# Patient Record
Sex: Male | Born: 1970 | Hispanic: Yes | Marital: Married | Smoking: Never smoker
Health system: Southern US, Community
[De-identification: ages and names within clinical notes are randomized; demographics above are authoritative.]

## PROBLEM LIST (undated history)

## (undated) DIAGNOSIS — K219 Gastro-esophageal reflux disease without esophagitis: Secondary | ICD-10-CM

## (undated) DIAGNOSIS — H269 Unspecified cataract: Secondary | ICD-10-CM

## (undated) DIAGNOSIS — E785 Hyperlipidemia, unspecified: Secondary | ICD-10-CM

## (undated) HISTORY — DX: Unspecified cataract: H26.9

## (undated) HISTORY — PX: TONSILLECTOMY: SHX5217

## (undated) HISTORY — DX: Hyperlipidemia, unspecified: E78.5

## (undated) HISTORY — DX: Gastro-esophageal reflux disease without esophagitis: K21.9

---

## 2008-02-09 HISTORY — PX: CATARACT EXTRACTION: SUR2

## 2010-11-17 ENCOUNTER — Encounter: Payer: Self-pay | Admitting: Family Medicine

## 2010-11-17 ENCOUNTER — Ambulatory Visit (INDEPENDENT_AMBULATORY_CARE_PROVIDER_SITE_OTHER): Payer: Self-pay | Admitting: Family Medicine

## 2010-11-17 DIAGNOSIS — R252 Cramp and spasm: Secondary | ICD-10-CM

## 2010-11-17 DIAGNOSIS — E782 Mixed hyperlipidemia: Secondary | ICD-10-CM | POA: Insufficient documentation

## 2010-11-17 DIAGNOSIS — E785 Hyperlipidemia, unspecified: Secondary | ICD-10-CM

## 2010-11-17 DIAGNOSIS — E669 Obesity, unspecified: Secondary | ICD-10-CM

## 2010-11-17 DIAGNOSIS — R233 Spontaneous ecchymoses: Secondary | ICD-10-CM

## 2010-11-17 DIAGNOSIS — E7849 Other hyperlipidemia: Secondary | ICD-10-CM

## 2010-11-17 NOTE — Patient Instructions (Signed)
Fue un Research officer, trade union. Me alegro tenerle de paciente!  Estoy [employer] algunos estudios de [place], que se deben hacer despues de 8 horas sin nada de comer ni tomar menos agua.  Por favor venga en ayunas para [employer].  [facility] llamo con los [place] al [phone] [place]).    MAKE FOLLOW UP APPT WITH DR Mauricio Po IN 4 TO 6 WEEKS.

## 2010-11-17 NOTE — Assessment & Plan Note (Signed)
Bruise on L thigh, anterior aspect.  He does not recall how he got the bruise, says this happens frequently. 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 No easy bleeding from gums or epistaxis.  TO check CBC, coags.

## 2010-11-17 NOTE — Assessment & Plan Note (Signed)
Family members with high cholesterol, patient with BMI exceeding 30.  TO check lipids; he may have had checked in the past with elevation.  He is not fasting at the time of this visit, therefore to order to be done as fasting labs at his convenience.

## 2010-11-17 NOTE — Progress Notes (Signed)
  Subjective:    Patient ID: Shawn French, male    DOB: Oct 11, 1970, 40 y.o.   MRN: [id number]  HPI Visit conducted in Spanish. Here today for his first visit with wife, Shawn French.  Feels well, wants to establish and get a checkup.  History of of high cholesterol in the past, not on treatment for this.  Mother and a sister with this.    Does not smoke or drink; gave up both in September 18, 2009.  Lives with wife and daughter; works in [employer].   Family Hx; Denies any known history of cardiac disease or DM in family. Maternal Grandmother died of cancer with unknown primary, no other family members with cancer. No bleeding diatheses in family.    Review of Systems  Denies weight change, no fevers or chills, no cough, no sputum production. No epistaxis, no chest discomfort or palpitations.  Occasional heartburn with acid (vinegar) or fatty meals. No nausea or vomiting, no diarrhea. REPORTS EASY BRUISING WITH BRUISES ARISING IN ANTERIOR THIGHS WITHOUT RECALLING INJURY OR TRAUMA.     Objective:   Physical Exam  Constitutional: He appears well-developed and well-nourished. No distress.  HENT:  Head: Normocephalic.  Right Ear: External ear normal.  Left Ear: External ear normal.  Mouth/Throat: Oropharynx is clear and moist. No oropharyngeal exudate.  Eyes: Conjunctivae are normal. Right eye exhibits no discharge. Left eye exhibits no discharge. No scleral icterus.  Neck: Normal range of motion. Neck supple. No JVD present. No thyromegaly present.  Cardiovascular: Normal rate, regular rhythm and normal heart sounds.  Exam reveals no gallop and no friction rub.   No murmur heard. Pulmonary/Chest: Breath sounds normal. No respiratory distress. He has no wheezes. He has no rales. He exhibits no tenderness.  Abdominal: Soft. Bowel sounds are normal. He exhibits no distension and no mass. There is no tenderness.  Musculoskeletal: Normal range of motion. He exhibits no edema and no tenderness.    Lymphadenopathy:    He has no cervical adenopathy.  Skin: Skin is warm and dry. He is not diaphoretic.       Ecchymosis measuring 2cm2 along anterior aspect L thigh.  Psychiatric: He has a normal mood and affect. His behavior is normal. Judgment and thought content normal.          Assessment & Plan:

## 2010-11-17 NOTE — Assessment & Plan Note (Signed)
Very painful episode leg cramps that woke him from sleep 1 month ago.  Frequently with "legs fall asleep".  To check TSH, CBC for possible RLS causes, although these spells do not sound like RLS.  Likely positional.

## 2010-12-23 ENCOUNTER — Ambulatory Visit: Payer: Self-pay | Admitting: Family Medicine

## 2012-03-03 ENCOUNTER — Ambulatory Visit: Payer: Self-pay | Admitting: Family Medicine

## 2012-04-11 ENCOUNTER — Ambulatory Visit: Payer: Self-pay | Admitting: Family Medicine

## 2012-04-14 ENCOUNTER — Ambulatory Visit: Payer: Self-pay | Admitting: Family Medicine

## 2012-05-09 ENCOUNTER — Encounter: Payer: Self-pay | Admitting: Family Medicine

## 2012-05-09 ENCOUNTER — Ambulatory Visit (INDEPENDENT_AMBULATORY_CARE_PROVIDER_SITE_OTHER): Payer: BC Managed Care – PPO | Admitting: Family Medicine

## 2012-05-09 VITALS — BP 116/68 | HR 66 | Ht 68.0 in | Wt 199.6 lb

## 2012-05-09 DIAGNOSIS — E669 Obesity, unspecified: Secondary | ICD-10-CM

## 2012-05-09 DIAGNOSIS — R233 Spontaneous ecchymoses: Secondary | ICD-10-CM

## 2012-05-09 DIAGNOSIS — R5383 Other fatigue: Secondary | ICD-10-CM | POA: Insufficient documentation

## 2012-05-09 DIAGNOSIS — R5381 Other malaise: Secondary | ICD-10-CM

## 2012-05-09 LAB — CBC WITH DIFFERENTIAL/PLATELET
Eosinophils Relative: 2 % (ref 0–5)
HCT: 43.9 % (ref 39.0–52.0)
Lymphocytes Relative: 28 % (ref 12–46)
Lymphs Abs: 1.7 10*3/uL (ref 0.7–4.0)
MCV: 87.5 fL (ref 78.0–100.0)
Monocytes Absolute: 0.4 10*3/uL (ref 0.1–1.0)
RBC: 5.02 MIL/uL (ref 4.22–5.81)
WBC: 5.9 10*3/uL (ref 4.0–10.5)

## 2012-05-09 LAB — COMPREHENSIVE METABOLIC PANEL
ALT: 15 U/L (ref 0–53)
AST: 19 U/L (ref 0–37)
Albumin: 4.6 g/dL (ref 3.5–5.2)
BUN: 15 mg/dL (ref 6–23)
CO2: 27 mEq/L (ref 19–32)
Calcium: 9.5 mg/dL (ref 8.4–10.5)
Chloride: 104 mEq/L (ref 96–112)
Potassium: 4.2 mEq/L (ref 3.5–5.3)

## 2012-05-09 LAB — TSH: TSH: 1.325 u[IU]/mL (ref 0.350–4.500)

## 2012-05-09 LAB — LIPID PANEL
Cholesterol: 204 mg/dL — ABNORMAL HIGH (ref 0–200)
HDL: 48 mg/dL (ref >39)

## 2012-05-09 NOTE — Patient Instructions (Addendum)
Fue un Research officer, trade union.  Estamos chequeando varios estudios para Neurosurgeon, la tiroide, la funcion renal y hepatica, el conteo de sangre y Armed forces logistics/support/administrative officer.  Le llamo al [phone] con [address] [place].  Quiero verle de nuevo en 4 a 6 semanas.  FOLLOW UP WITH DR Mauricio Po IN 4 TO 6 WEEKS.

## 2012-05-10 NOTE — Assessment & Plan Note (Signed)
Small bruise on anterior L thigh, does not appear worrisome. Reassurance given.  Patient requests blood work to evaluate, has been seen for this in the past.  Does not have easy or prolonged bleeding with minor injuries, no epistaxis, no bleeding from gums or rectum.

## 2012-05-10 NOTE — Assessment & Plan Note (Signed)
High stress in his job.  I am concenred about mood disorders. PHQ9 in Spanish at follow up visit after labs.

## 2012-05-10 NOTE — Assessment & Plan Note (Signed)
Discussed ways to begin to get exercise, which will also help with mood and stress reduction.

## 2012-05-10 NOTE — Progress Notes (Signed)
  Subjective:    Patient ID: Shawn French, male    DOB: [birth year], 42 y.o.   MRN: [id number]  HPI Patient here with his wife; visit conducted in [place].  He is here for checkup, plus addressing a few other issues.  He reports feeling very overwhelmed by stress that is related to work.  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  Stays up at night thinking about work.  Gets up around 6am to go to work, gets home after 7pm.  Doesn't have time to exercise.  Concerned that the stress could give him diabetes. Does not endorse feeling of depression.  Cannot name things he does for recreation.    Concerned about what he describes as easy bruising on his anterior thighs without associated trauma.  Again, concerned about diabetes.   Family Hx; Mother with hypertension, high cholesterol.  Maternal grandmother with cancer NOS.  No history of DM in family. Sister with thyroid disease.   Social Hx; previously a social smoker and beer drinker, quit all of these things 3 years ago.  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  Review of Systems  No chest pain, no shortness of breath, no cough, no fevers/chills, no weight changes, no constipation or blood in stool, no changes in bowel or urinary habits. No swelling in legs.       Objective:   Physical Exam Well appearing, no apparent distress HEENT Neck supple, no cervical adenopathy, no thyroid tenderness or nodules, no goiter.  COR Regular S1S2, no extra sounds PULM Clear bilaterally, no rales or wheezes ABD Soft, nontender, nondistended.  LEs no edema noted.         Assessment & Plan:

## 2012-05-11 ENCOUNTER — Encounter: Payer: Self-pay | Admitting: Family Medicine

## 2012-06-16 ENCOUNTER — Ambulatory Visit (INDEPENDENT_AMBULATORY_CARE_PROVIDER_SITE_OTHER): Payer: BC Managed Care – PPO | Admitting: Family Medicine

## 2012-06-16 ENCOUNTER — Encounter: Payer: Self-pay | Admitting: Family Medicine

## 2012-06-16 VITALS — BP 117/80 | HR 65 | Ht 68.0 in | Wt 194.0 lb

## 2012-06-16 DIAGNOSIS — R5383 Other fatigue: Secondary | ICD-10-CM

## 2012-06-16 MED ORDER — FLUOXETINE HCL 20 MG PO CAPS: 20.0000 mg | ORAL_CAPSULE | Freq: Every day | ORAL | Status: DC

## 2012-06-16 NOTE — Patient Instructions (Addendum)
Mande' la receta a Garment/textile technologist de [facility] en [place] [employer]. Es la fluoxetine 20mg , una capsula por dia por las [place].   Quiero volver a verle en 2 a 3 semanas para ver como esta' yendo con la medicina.    ([address], [place] de [address]    MAKE FOLLOW UP WITH DR Mauricio Po IN 2 TO 3 WEEKS.

## 2012-06-16 NOTE — Assessment & Plan Note (Signed)
Fatigue with component of anxiety that appears to be secondary to workplace stress. This appears to be affecting his relationship with his family.  We discussed alternatives to quitting his job, such as trying to force time for time with his family, regular exercise, etc. Discussed counseling options ([facility] [place]), also medication options.  Shawn French is interested in considering medications,  Will start with fluoxetine 20mg  daily, discussed common side effects and will plan follow up in 2 to 3 weeks to see how he is doing.

## 2012-06-16 NOTE — Progress Notes (Signed)
  Subjective:    Patient ID: Shawn French, male    DOB: Feb 04, 1971, 42 y.o.   MRN: [id number]  HPI Patient here for follow up on stress.  Visit conducted in Spanish.  Patient's wife Shawn French) is here as well.   Shawn French notes that he has continued to feel stressed; he received my letter with lab results and was relieved that he does not have diabetes (his greatest medical concern) or thyroid problems.  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  He is responsible for the crew's performance and often is held responsible for the actions of workers under his supervision.  For this, he works long hours and frequently receives calls about problems on various work sites, even when not physically at work.  He says his family has noted he "never has time", always appears pressured and is easily irritated.  He states that he enjoys his work best on Saturdays, when the [employer] representative crews are usually not working (and therefore not provoking the calls of clients).  He says that the most time he has for vacations in a couple days to make a brief trip with the family (he gives example of the beach), during which time he usually fields calls and works while away with his family.   Denies depressive symptoms. Not engaged in physical activity.  Recognizes work as the primary cause of his stress, but states that the tradeoff of his good salary is difficult to turn down in return for more leisure time and a lower salary/more stress about money in this home.  This tension is something he and his wife talk about frequently.   PHQ9 completed today in Spanish scores a 8 (3 pts Q#4; 2 pts Q#5; 1 pts Q#2,3,7).  Review of Systems see HPI     Objective:   Physical Exam Well appearing, no apparent distress.  Speech fluid, not pressured. Not emotionally labile.        Assessment & Plan:

## 2012-07-04 ENCOUNTER — Ambulatory Visit: Payer: BC Managed Care – PPO | Admitting: Family Medicine

## 2012-10-05 ENCOUNTER — Ambulatory Visit (INDEPENDENT_AMBULATORY_CARE_PROVIDER_SITE_OTHER): Payer: BC Managed Care – PPO | Admitting: Family Medicine

## 2012-10-05 ENCOUNTER — Ambulatory Visit: Payer: BC Managed Care – PPO

## 2012-10-05 VITALS — BP 108/72 | HR 60 | Temp 98.6°F | Resp 18 | Ht 68.0 in | Wt 198.0 lb

## 2012-10-05 DIAGNOSIS — R1011 Right upper quadrant pain: Secondary | ICD-10-CM

## 2012-10-05 DIAGNOSIS — R079 Chest pain, unspecified: Secondary | ICD-10-CM

## 2012-10-05 DIAGNOSIS — R0781 Pleurodynia: Secondary | ICD-10-CM

## 2012-10-05 DIAGNOSIS — S2241XA Multiple fractures of ribs, right side, initial encounter for closed fracture: Secondary | ICD-10-CM

## 2012-10-05 LAB — POCT CBC
Granulocyte percent: 63.8 %G (ref 37–80)
MID (cbc): 0.4 (ref 0–0.9)
MPV: 8.6 fL (ref 0–99.8)
POC Granulocyte: 4.5 (ref 2–6.9)
POC LYMPH PERCENT: 30.1 %L (ref 10–50)
Platelet Count, POC: 315 10*3/uL (ref 142–424)
RDW, POC: 13 %

## 2012-10-05 LAB — COMPREHENSIVE METABOLIC PANEL
Alkaline Phosphatase: 62 U/L (ref 39–117)
Calcium: 9.1 mg/dL (ref 8.4–10.5)
Potassium: 4.3 mEq/L (ref 3.5–5.3)
Sodium: 136 mEq/L (ref 135–145)

## 2012-10-05 MED ORDER — OXYCODONE-ACETAMINOPHEN 5-325 MG PO TABS: 1.0000 | ORAL_TABLET | Freq: Four times a day (QID) | ORAL | Status: DC | PRN

## 2012-10-05 NOTE — Patient Instructions (Signed)
Fractura de costillas (Rib Fracture) Usted ha sufrido una fractura (quebradura de hueso) en [place]. Esto sucede como consecuencia de un golpe en el pecho o una cada contra un objeto duro o por tos o estornudo violentos. Puede haber una o ms fracturas. Las fracturas de costilla pueden curarse por s mismas en 3 a 8 semanas. Los perodos de curacin ms prolongados generalmente se asocian a tos continua o a otras actividades que agravan el problema. INSTRUCCIONES PARA EL CUIDADO DOMICILIARIO  Evite las actividades extenuantes. Sea cuidadoso al [employer]. Evite los golpes en la costilla daada. Los movimientos que Data processing manager y presin Photographer de la fractura debern evitarse en lo posible.  Consuma una dieta normal y bien balanceada. Beba gran cantidad de lquidos para evitar el estreimiento.  Respire profundo varias veces al da para [employer] pulmones libres de infecciones. Trate de toser [employer] al da, sosteniendo el rea lesionada con [place]. Esto ayudar a Charity fundraiser.  No use cinturones ni sujetadores. Esta limitacin de la respiracin puede conducir a la neumona.  Utilice los medicamentos de venta libre o de prescripcin para Chief Technology Officer, Environmental health practitioner o la [place], segn se lo indique el profesional que lo asiste. SOLICITE ATENCIN MDICA SI: Aparece tos continua, asociada a esputo espeso o sanguinolento. SOLICITE ATENCIN MDICA DE INMEDIATO SI:  Tiene fiebre.  Tiene dificultad para respirar.  Tiene nuseas, vmitos o dolor abdominal.  El dolor aumenta y no se alivia con los medicamentos. Document Released: 11/04/2004 Document Revised: 04/19/2011 [facility] Patient Information 2014 [place], [place].

## 2012-10-05 NOTE — Progress Notes (Signed)
Subjective:    Patient ID: Shawn French, male    DOB: 11/25/1970, 42 y.o.   MRN: [id number] Chief Complaint  Patient presents with  . right side pain x1 week    fell on sidewalk     HPI  1 week ago fell about 4 ft onto concrete and landed onto his right side/flank area.  Very severe pain in right with coughing, laughing, bending.  Does a lot of heavy lifting at work which is very painful.  No SHoB or cough, no CP. No n/v, no blood in stool or urine. Nml GI/GU.  Has tried tylenol/ibuprofen with minimal relief.  Past Medical History  Diagnosis Date  . GERD (gastroesophageal reflux disease)   . Hyperlipidemia    Current Outpatient Prescriptions on File Prior to Visit  Medication Sig Dispense Refill  . FLUoxetine (PROZAC) 20 MG capsule Take 1 capsule (20 mg total) by mouth daily.  30 capsule  6   No current facility-administered medications on file prior to visit.   No Known Allergies   Review of Systems  Constitutional: Positive for activity change. Negative for fever and chills.  Respiratory: Negative for cough, chest tightness and shortness of breath.   Cardiovascular: Negative for chest pain.  Gastrointestinal: Positive for abdominal pain. Negative for diarrhea, constipation, blood in stool and anal bleeding.  Genitourinary: Negative for urgency, frequency, hematuria, decreased urine volume and difficulty urinating.  Musculoskeletal: Positive for myalgias, back pain and arthralgias. Negative for gait problem.  Skin: Negative for color change, pallor, rash and wound.  Neurological: Positive for weakness. Negative for dizziness, light-headedness and numbness.  Hematological: Negative for adenopathy. Does not bruise/bleed easily.  Psychiatric/Behavioral: Positive for sleep disturbance.      BP 108/72  Pulse 60  Temp(Src) 98.6 F (37 C) (Oral)  Resp 18  Ht 5\' 8"  (1.727 m)  Wt 198 lb (89.812 kg)  BMI 30.11 kg/m2  SpO2 98% Objective:   Physical Exam  Constitutional: He is  oriented to person, place, and time. He appears well-developed and well-nourished. No distress.  HENT:  Head: Normocephalic and atraumatic.  Eyes: Conjunctivae are normal. Pupils are equal, round, and reactive to light. No scleral icterus.  Neck: Normal range of motion. Neck supple. No thyromegaly present.  Cardiovascular: Normal rate, regular rhythm, normal heart sounds and intact distal pulses.   Pulmonary/Chest: Effort normal and breath sounds normal. No respiratory distress. He has no decreased breath sounds. He has no wheezes. He has no rhonchi. He has no rales. He exhibits tenderness and bony tenderness. He exhibits no laceration, no crepitus, no deformity, no swelling and no retraction. Right breast exhibits no inverted nipple, no skin change and no tenderness. Breasts are symmetrical.    Very ttp over lower lateral and anterior right ribs  Abdominal: Soft. Normal appearance and bowel sounds are normal. There is no hepatosplenomegaly. There is tenderness in the right upper quadrant. There is CVA tenderness. There is no rigidity, no rebound, no guarding, no tenderness at McBurney's point and negative Murphy's sign.  Musculoskeletal: He exhibits no edema.  Lymphadenopathy:    He has no cervical adenopathy.  Neurological: He is alert and oriented to person, place, and time.  Skin: Skin is warm and dry. He is not diaphoretic.  Psychiatric: He has a normal mood and affect. His behavior is normal.      Results for orders placed in visit on 10/05/12  POCT CBC      Result Value Range   WBC 7.0  4.6 -  10.2 K/uL   Lymph, poc 2.1  0.6 - 3.4   POC LYMPH PERCENT 30.1  10 - 50 %L   MID (cbc) 0.4  0 - 0.9   POC MID % 6.1  0 - 12 %M   POC Granulocyte 4.5  2 - 6.9   Granulocyte percent 63.8  37 - 80 %G   RBC 4.92  4.69 - 6.13 M/uL   Hemoglobin 15.4  14.1 - 18.1 g/dL   HCT, POC 16.1  09.6 - 53.7 %   MCV 95.4  80 - 97 fL   MCH, POC 31.3 (*) 27 - 31.2 pg   MCHC 32.8  31.8 - 35.4 g/dL   RDW,  POC 04.5     Platelet Count, POC 315  142 - 424 K/uL   MPV 8.6  0 - 99.8 fL       [facility] reading (PRIMARY) by  Dr. Clelia Croft. KUB: Stool in RUQ, no acute abnormality, normal bowel gas pattern. Rt rib xray: Minimally displaced fracture of distal ends of floating 11th and 12th ribs, poss fracture of posterior lateral 8th and 9th right ribs. Assessment & Plan:  Rib pain on right side - Plan: DG Ribs Unilateral W/Chest Right  Abdominal pain, right upper quadrant - Plan: DG Abd 1 View, POCT CBC, Comprehensive metabolic panel  Fracture of multiple ribs, right, closed, initial encounter - light duty at work- no lifting > 15 lbs x 2 wks, ice, rest. Recheck in 2 wks if pain continues.  Meds ordered this encounter  Medications  . oxyCODONE-acetaminophen (ROXICET) 5-325 MG per tablet    Sig: Take 1 tablet by mouth every 6 (six) hours as needed for pain.    Dispense:  40 tablet    Refill:  0

## 2012-10-07 ENCOUNTER — Encounter: Payer: Self-pay | Admitting: Family Medicine

## 2013-06-23 ENCOUNTER — Emergency Department (HOSPITAL_COMMUNITY)
Admission: EM | Admit: 2013-06-23 | Discharge: 2013-06-23 | Disposition: A | Discharge status: Home / Self Care | Payer: BC Managed Care – PPO | Attending: Emergency Medicine | Admitting: Emergency Medicine

## 2013-06-23 ENCOUNTER — Emergency Department (HOSPITAL_COMMUNITY): Payer: BC Managed Care – PPO

## 2013-06-23 ENCOUNTER — Encounter (HOSPITAL_COMMUNITY): Payer: Self-pay | Admitting: Emergency Medicine

## 2013-06-23 DIAGNOSIS — Z79899 Other long term (current) drug therapy: Secondary | ICD-10-CM | POA: Insufficient documentation

## 2013-06-23 DIAGNOSIS — Z8719 Personal history of other diseases of the digestive system: Secondary | ICD-10-CM | POA: Insufficient documentation

## 2013-06-23 DIAGNOSIS — R209 Unspecified disturbances of skin sensation: Secondary | ICD-10-CM | POA: Insufficient documentation

## 2013-06-23 DIAGNOSIS — R42 Dizziness and giddiness: Secondary | ICD-10-CM

## 2013-06-23 DIAGNOSIS — R202 Paresthesia of skin: Secondary | ICD-10-CM

## 2013-06-23 DIAGNOSIS — Z862 Personal history of diseases of the blood and blood-forming organs and certain disorders involving the immune mechanism: Secondary | ICD-10-CM | POA: Insufficient documentation

## 2013-06-23 DIAGNOSIS — Z87891 Personal history of nicotine dependence: Secondary | ICD-10-CM | POA: Insufficient documentation

## 2013-06-23 DIAGNOSIS — Z8639 Personal history of other endocrine, nutritional and metabolic disease: Secondary | ICD-10-CM | POA: Insufficient documentation

## 2013-06-23 DIAGNOSIS — R29898 Other symptoms and signs involving the musculoskeletal system: Secondary | ICD-10-CM | POA: Insufficient documentation

## 2013-06-23 LAB — CBC
HCT: 43.8 % (ref 39.0–52.0)
Hemoglobin: 14.8 g/dL (ref 13.0–17.0)
MCH: 30.5 pg (ref 26.0–34.0)
MCHC: 33.8 g/dL (ref 30.0–36.0)
MCV: 90.1 fL (ref 78.0–100.0)
Platelets: 281 10*3/uL (ref 150–400)
RBC: 4.86 MIL/uL (ref 4.22–5.81)
RDW: 12.7 % (ref 11.5–15.5)
WBC: 7.1 10*3/uL (ref 4.0–10.5)

## 2013-06-23 LAB — BASIC METABOLIC PANEL
BUN: 15 mg/dL (ref 6–23)
CO2: 24 meq/L (ref 19–32)
CREATININE: 0.63 mg/dL (ref 0.50–1.35)
Calcium: 8.9 mg/dL (ref 8.4–10.5)
Chloride: 106 mEq/L (ref 96–112)
GFR calc Af Amer: >90 mL/min (ref >90)
GFR calc non Af Amer: >90 mL/min (ref >90)
GLUCOSE: 97 mg/dL (ref 70–99)
Potassium: 4.6 mEq/L (ref 3.7–5.3)
Sodium: 142 mEq/L (ref 137–147)

## 2013-06-23 MED ORDER — MECLIZINE HCL 25 MG PO TABS: 25.0000 mg | ORAL_TABLET | Freq: Three times a day (TID) | ORAL | Status: DC | PRN

## 2013-06-23 NOTE — ED Notes (Signed)
Pt. Stated, I've had dizziness since last night and my left leg is numb.

## 2013-06-23 NOTE — ED Notes (Signed)
Pt states got dizzy and nauseated last nite at 0900 and states woke up at 0730 still felt dizzy.  Went to work and at about 0900 left leg felt numb to him and it continues, but better

## 2013-06-23 NOTE — Discharge Instructions (Signed)
Vértigo  (Vertigo)   Vértigo es la sensación de que se está moviendo estando quieto. Puede ser peligroso si ocurre cuando está trabajado, conduciendo vehículos o realizando actividades difíciles.   CAUSAS   El vértigo se produce cuando hay un conflicto en las señales que se envían al cerebro desde los sistemas visual y sensorial del cuerpo. Hay numerosas causas que originan este problema, entre las que se incluyen:   · Infecciones, especialmente en el oído interno.  · Una mala reacción a un medicamento o mal uso de alcohol y fármacos.  · Abstinencia de drogas o alcohol.  · Cambios rápidos de posición, como al acostarse o darse vuelta en la cama.  · Dolor de cabeza migrañoso.  · Disminución del flujo sanguíneo hacia el cerebro.  · Aumento de la presión en el cerebro por un traumatismo, infección, tumor o sangrado en la cabeza.  SÍNTOMAS   Puede sentir como si el mundo da vueltas o va a caer al piso. Como hay problemas en el equilibrio, el vértigo puede causar náuseas y vómitos. Tiene movimientos oculares involuntarios (nistagmus).   DIAGNÓSTICO   El vértigo normalmente se diagnostica con un examen físico. Si la causa no se conoce, el médico puede indicar diagnóstico por imágenes, como una resonancia magnética (imágenes por resonancia magnética).   TRATAMIENTO   La mayor parte de los casos de vértigo se resuelve sin tratamiento. Según la causa, el médico podrá recetar ciertos medicamentos. Si se relaciona con la posición del cuerpo, podrá recomendarle movimientos o procedimientos para corregir el problema. En algunos casos raros, si la causa del vértigo es un problema en el oído interno, necesitará una cirugía.   INSTRUCCIONES PARA EL CUIDADO DOMICILIARIO  · Siga las indicaciones del médico.  · Evite conducir vehículos.  · Evite operar maquinarias pesadas.  · Evite realizar tareas que serían peligrosas para usted u otras personas durante un episodio de vértigo.  · Comuníquele al médico si nota que ciertos medicamentos  parecen asociarse con las crisis. Algunos medicamentos que se usan para tratar los episodios, en algunas personas los empeoran.  SOLICITE ATENCIÓN MÉDICA DE INMEDIATO SI:  · Los medicamentos no alivian las crisis o hacen que estas empeoren.  · Tiene dificultad para hablar, caminar, siente debilidad o tiene problemas para usar los brazos, las manos o las piernas.  · Comienza a sufrir un dolor de cabeza intenso.  · Las náuseas y los vómitos no se alivian o se agravan.  · Aparecen trastornos visuales.  · Un miembro de su familia nota cambios en su conducta.  · Hay alguna modificación en su trastorno que parece hacerlo empeorar en lugar de mejorar.  ASEGÚRESE DE QUE:   · Comprende estas instrucciones.  · Controlará su enfermedad.  · Solicitará ayuda de inmediato si no mejora o si empeora.  Document Released: 11/04/2004 Document Revised: 04/19/2011  [employer]® Patient Information ©2014 [employer].

## 2013-06-23 NOTE — ED Notes (Signed)
Patient transported to MRI 

## 2013-06-23 NOTE — ED Notes (Signed)
Patient denies pain and is resting comfortably.  

## 2013-06-23 NOTE — ED Provider Notes (Signed)
CSN: [id number]     Arrival date & time 06/23/13  1038 History   First MD Initiated Contact with Patient 06/23/13 1148     Chief Complaint  Patient presents with  . Dizziness  . Extremity Weakness     (Consider location/radiation/quality/duration/timing/severity/associated sxs/prior Treatment) HPI Comments: Patient presents to the ER for evaluation of dizziness and left leg numbness. Patient reports onset of dizziness last night. He reports that he was experiencing intermittent feelings of the room spinning around him with changes in position of his head. This occurred throughout the night when he would get up and was still present this morning. He went to work anyway, around 9 AM, had sudden onset of difficulty walking. He reports that his leg suddenly went to sleep. It has progressively worsened and is almost back to normal at this point in time. He does not have any headache. He is never had similar symptoms before.  Patient is a 43 y.o. male presenting with dizziness and extremity weakness.  Dizziness Extremity Weakness    Past Medical History  Diagnosis Date  . GERD (gastroesophageal reflux disease)   . Hyperlipidemia    History reviewed. No pertinent past surgical history. Family History  Problem Relation Age of Onset  . Hyperlipidemia Mother   . Hypertension Mother   . Hyperlipidemia Sister    History  Substance Use Topics  . Smoking status: Former Games developermoker  . Smokeless tobacco: Not on file  . Alcohol Use: Not on file    Review of Systems  Musculoskeletal: Positive for extremity weakness.  Neurological: Positive for dizziness and numbness.  All other systems reviewed and are negative.     Allergies  Review of patient's allergies indicates no known allergies.  Home Medications   Prior to Admission medications   Medication Sig Start Date End Date Taking? Authorizing Provider  FLUoxetine (PROZAC) 20 MG capsule Take 1 capsule (20 mg total) by mouth daily. 06/16/12    Barbaraann BarthelJames O Breen, MD  oxyCODONE-acetaminophen (ROXICET) 5-325 MG per tablet Take 1 tablet by mouth every 6 (six) hours as needed for pain. 10/05/12   Sherren MochaEva N Shaw, MD   BP 123/83  Pulse 66  Temp(Src) 97.6 F (36.4 C) (Oral)  Resp 17  Ht 5\' 10"  (1.778 m)  Wt 195 lb (88.451 kg)  BMI 27.98 kg/m2  SpO2 97% Physical Exam  Constitutional: He is oriented to person, place, and time. He appears well-developed and well-nourished. No distress.  HENT:  Head: Normocephalic and atraumatic.  Right Ear: Hearing normal.  Left Ear: Hearing normal.  Nose: Nose normal.  Mouth/Throat: Oropharynx is clear and moist and mucous membranes are normal.  Eyes: Conjunctivae and EOM are normal. Pupils are equal, round, and reactive to light.  Neck: Normal range of motion. Neck supple.  Cardiovascular: Regular rhythm, S1 normal and S2 normal.  Exam reveals no gallop and no friction rub.   No murmur heard. Pulmonary/Chest: Effort normal and breath sounds normal. No respiratory distress. He exhibits no tenderness.  Abdominal: Soft. Normal appearance and bowel sounds are normal. There is no hepatosplenomegaly. There is no tenderness. There is no rebound, no guarding, no tenderness at McBurney's point and negative Murphy's sign. No hernia.  Musculoskeletal: Normal range of motion.  Neurological: He is alert and oriented to person, place, and time. He has normal strength. No cranial nerve deficit or sensory deficit. Coordination normal. GCS eye subscore is 4. GCS verbal subscore is 5. GCS motor subscore is 6.  Normal finger to nose  bilaterally No pronator drift upper and lower extremities Strength 5 out of 5, symmetric Lower extremity strength 5 out of 5, symmetric against gravity Normal heel-to-shin.  Skin: Skin is warm, dry and intact. No rash noted. No cyanosis.  Psychiatric: He has a normal mood and affect. His speech is normal and behavior is normal. Thought content normal.    ED Course  Procedures (including  critical care time) Labs Review Labs Reviewed  BASIC METABOLIC PANEL  CBC    Imaging Review Mr Brain Wo Contrast  06/23/2013   CLINICAL DATA:  Dizziness.  Left-sided weakness  EXAM: MRI HEAD WITHOUT CONTRAST  TECHNIQUE: Multiplanar, multiecho pulse sequences of the brain and surrounding structures were obtained without intravenous contrast.  COMPARISON:  none  FINDINGS: Negative for acute infarct.  Ventricle size is normal. Craniocervical junction normal. Pituitary is normal in size.  Negative for hemorrhage or mass lesion. No shift of the midline structures. Cerebral white matter is normal. Negative for demyelinating disease.  Paranasal sinuses are clear.  IMPRESSION: Negative   Electronically Signed   By: Marlan Palauharles  Clark M.D.   On: 06/23/2013 14:10     EKG Interpretation   Date/Time:  Saturday Jun 23 2013 10:48:50 EDT Ventricular Rate:  64 PR Interval:  158 QRS Duration: 86 QT Interval:  368 QTC Calculation: 379 R Axis:   128 Text Interpretation:  Normal sinus rhythm Left posterior fascicular block  Abnormal ECG No previous tracing Confirmed by POLLINA  MD, CHRISTOPHER  ([id number]) on 06/23/2013 12:07:56 PM      MDM   Final diagnoses:  Vertigo  Paresthesia    Patient presents to the ER for evaluation of dizziness and left leg numbness and tingling. The dizziness began last night. This morning he had onset of paresthesia type symptoms in the left leg. Paresthesias are improving. Patient underwent extensive workup including MRI which was negative. I did have a consult by neurology who felt that the patient did not need further workup for TIA, as his symptoms were not consistent with TIA. It was recommended he be treated for peripheral vertigo and followup with primary doctor.    Gilda Creasehristopher J. Pollina, MD 06/24/13 [phone]

## 2013-06-23 NOTE — Consult Note (Signed)
Reason for Consult: Dizziness and left leg numbness Referring Physician: Dr Blinda Leatherwood  CC:  Dizziness and left leg numbness   HPI: Shawn French is a 43 y.o. male who presented to the emergency department today for evaluation of dizziness. The patient states that he took his children shopping at the mall last night and when he returned home he felt dizzy. He felt as if the room was spinning around him. There was no improvement with lying or sitting. The patient eventually went to bed but did not sleep well. He still felt dizzy when he got up this morning but went on to work at his [employer] job. He felt dizzy there as well. He also noted that his left leg was numb from the knee down. He denies back problems. He denies ear pain. The left leg numbness has since resolved. The patient states he never had similar symptoms in the past. An MRI of the brain was performed here which was negative. The patient admits to being under a great deal of stress recently as he has a cousin at [facility] now with cancer of the brain. This has been weighing heavily on him. He had been prescribed an anxiety medication through family practice but no longer takes this medication.  Past Medical History  Diagnosis Date  . GERD (gastroesophageal reflux disease)   . Hyperlipidemia     History reviewed. No pertinent past surgical history.  Family History  Problem Relation Age of Onset  . Hyperlipidemia Mother   . Hypertension Mother   . Hyperlipidemia Sister     Social History:  reports that he has quit smoking. He does not have any smokeless tobacco history on file. His alcohol and drug histories are not on file.  No Known Allergies  Medications: No current facility-administered medications for this encounter.   No current outpatient prescriptions on file.    I have reviewed the patient's current medications.  ROS: History obtained from the patient  The patient reports feeling fine recently and essentially has a  negative review of systems except as mentioned above.  General ROS: negative for - chills, fatigue, fever, night sweats, weight gain or weight loss Psychological ROS: negative for - behavioral disorder, hallucinations, memory difficulties, mood swings or suicidal ideation Ophthalmic ROS: negative for - blurry vision, double vision, eye pain or loss of vision ENT ROS: negative for - epistaxis, nasal discharge, oral lesions, sore throat, tinnitus or vertigo Allergy and Immunology ROS: negative for - hives or itchy/watery eyes Hematological and Lymphatic ROS: negative for - bleeding problems, bruising or swollen lymph nodes Endocrine ROS: negative for - galactorrhea, hair pattern changes, polydipsia/polyuria or temperature intolerance Respiratory ROS: negative for - cough, hemoptysis, shortness of breath or wheezing Cardiovascular ROS: negative for - chest pain, dyspnea on exertion, edema or irregular heartbeat Gastrointestinal ROS: negative for - abdominal pain, diarrhea, hematemesis, nausea/vomiting or stool incontinence Genito-Urinary ROS: negative for - dysuria, hematuria, incontinence or urinary frequency/urgency Musculoskeletal ROS: negative for - joint swelling or muscular weakness Neurological ROS: as noted in HPI Dermatological ROS: negative for rash and skin lesion changes   Physical Examination: Blood pressure 128/71, pulse 59, temperature 98.2 F (36.8 C), temperature source Oral, resp. rate 19, height 5\' 10"  (1.778 m), weight 195 lb (88.451 kg), SpO2 98.00%.  Neurologic Examination Mental Status: Alert, oriented, thought content appropriate.  Speech fluent without evidence of aphasia.  Able to follow 3 step commands without difficulty. Cranial Nerves: II: Discs not visualized; Visual fields grossly normal, pupils equal,  round, reactive to light and accommodation III,IV, VI: ptosis not present, extra-ocular motions intact bilaterally V,VII: smile symmetric, facial light touch  sensation normal bilaterally VIII: hearing normal bilaterally IX,X: gag reflex present XI: bilateral shoulder shrug XII: midline tongue extension Motor: Right : Upper extremity   5/5    Left:     Upper extremity   5/5  Lower extremity   5/5     Lower extremity   5/5 Tone and bulk:normal tone throughout; no atrophy noted Sensory: Pinprick and light touch intact throughout, bilaterally Deep Tendon Reflexes: 2+ and symmetric throughout Plantars: Right: downgoing   Left: downgoing Cerebellar: normal finger-to-nose, normal rapid alternating movements and normal heel-to-shin test Gait: Deferred  Laboratory Studies:   Basic Metabolic Panel:  Recent Labs Lab 06/23/13 1118  NA 142  K 4.6  CL 106  CO2 24  GLUCOSE 97  BUN 15  CREATININE 0.63  CALCIUM 8.9    Liver Function Tests: No results found for this basename: AST, ALT, ALKPHOS, BILITOT, PROT, ALBUMIN,  in the last 168 hours No results found for this basename: LIPASE, AMYLASE,  in the last 168 hours No results found for this basename: AMMONIA,  in the last 168 hours  CBC:  Recent Labs Lab 06/23/13 1118  WBC 7.1  HGB 14.8  HCT 43.8  MCV 90.1  PLT 281    Cardiac Enzymes: No results found for this basename: CKTOTAL, CKMB, CKMBINDEX, TROPONINI,  in the last 168 hours  BNP: No components found with this basename: POCBNP,   CBG: No results found for this basename: GLUCAP,  in the last 168 hours  Microbiology: No results found for this or any previous visit.  Coagulation Studies: No results found for this basename: LABPROT, INR,  in the last 72 hours  Urinalysis: No results found for this basename: COLORURINE, APPERANCEUR, LABSPEC, PHURINE, GLUCOSEU, HGBUR, BILIRUBINUR, KETONESUR, PROTEINUR, UROBILINOGEN, NITRITE, LEUKOCYTESUR,  in the last 168 hours  Lipid Panel:     Component Value Date/Time   CHOL 204* 05/09/2012 1124   TRIG 132 05/09/2012 1124   HDL 48 05/09/2012 1124   CHOLHDL 4.3 05/09/2012 1124   VLDL 26  05/09/2012 1124   LDLCALC 130* 05/09/2012 1124    HgbA1C:  Lab Results  Component Value Date   HGBA1C 5.3 05/09/2012    Urine Drug Screen:   No results found for this basename: labopia, cocainscrnur, labbenz, amphetmu, thcu, labbarb    Alcohol Level: No results found for this basename: ETH,  in the last 168 hours  Other results: EKG: NSR rate 64 BPM  Imaging:  Mr Brain Wo Contrast 06/23/2013   Negative       Assessment/Plan: 43 year old male presents for evaluation of dizziness which started last night and persists today. An MRI of the brain was negative. The patient admits to being under a significant amount of stress recently with a family member who is seriously ill. He had been on medications for anxiety in the past but no longer takes the medication. He does not remember the medication name.  Further impression and plan per Dr. Uvaldo RisingKirkpatrick  David Rinehuls PA-C [facility] Pager [phone] 06/23/2013, 5:07 PM   43 yo M with a history of migraines and mild current headache. He has had dizziness for multiple hours and it is already improving. Symptoms are elicited with dix-hallpike but no clear nystagmus is seen. The leg symptoms were positive(tingling) rather than negative. In the setting of a patient with history of migraine, few  other risk factors for stroke and positive, not negative symptoms I think that TIA is unlikely and would favor migraine aura. With his dizziness improving and no objective signs of vestibular dysfunction on exam, would not treat with steroids at this time. Given the duration of symptoms, if the dizziness was due to ischemia.   1) Meclizine PRN for dizziness.  2) No further recommendations at this time, but patient should return to ER for any further concerning symptoms.   Ritta SlotMcNeill Anasia Agro, MD [facility] [phone]  If 7pm- 7am, please page neurology on call as listed in AMION.

## 2013-06-23 NOTE — ED Notes (Signed)
Pt still in MRI 

## 2013-06-23 NOTE — ED Notes (Signed)
Pt ambulatory to restroom

## 2013-08-02 ENCOUNTER — Ambulatory Visit (INDEPENDENT_AMBULATORY_CARE_PROVIDER_SITE_OTHER): Payer: BC Managed Care – PPO | Admitting: Family Medicine

## 2013-08-02 VITALS — BP 126/84 | HR 74 | Temp 97.8°F | Resp 16 | Ht 68.25 in | Wt 188.6 lb

## 2013-08-02 DIAGNOSIS — S8012XA Contusion of left lower leg, initial encounter: Secondary | ICD-10-CM

## 2013-08-02 DIAGNOSIS — M79609 Pain in unspecified limb: Secondary | ICD-10-CM

## 2013-08-02 DIAGNOSIS — S8010XA Contusion of unspecified lower leg, initial encounter: Secondary | ICD-10-CM

## 2013-08-02 DIAGNOSIS — M79605 Pain in left leg: Secondary | ICD-10-CM

## 2013-08-02 NOTE — Progress Notes (Signed)
Subjective: 43 year old man who had a long that was standing up fell against his left leg about 12 days ago. It struck him just above the left popliteal fossa and then on down the leg. He continued functioning in life. This week he drove to [place] and back, 0.4 hours each way. He got a lot of swelling in the left leg. It started hurting more around the ankle. He developed ecchymosis around the heel and toes. Otherwise he is a healthy person. Not on any regular medicine.  Objective: No tenderness in the thigh. He has ecchymosis just above the medial aspect of his left popliteal fossa. It is a little tender there down to the mid calf, more significantly tender from the lower calf down into the foot. Negative Homans sign. He has 2+ to 3+ swelling. Pulses good.  Assessment: Trauma left leg with lower leg pain and swelling. Low to medium probability of DVT.  Plan: Elevate tonight. We will try to get a ultrasound of the leg first thing in the morning as soon as we can get it scheduled. Naproxen 440 mg twice daily

## 2013-08-02 NOTE — Patient Instructions (Signed)
Take Aleve 2 tablets twice daily.  Our office should contact you tomorrow morning to let you know where to go for the ultrasound. If you do not hear from the office by 12 noon please call and asked to speak to the team leader

## 2014-09-19 IMAGING — CR DG ABDOMEN 1V
1 series · 1 of 1 positions shown · non-contrast
Comparison: None.

CLINICAL DATA: Right rib pain.

EXAM:
ABDOMEN - 1 VIEW

[AP]
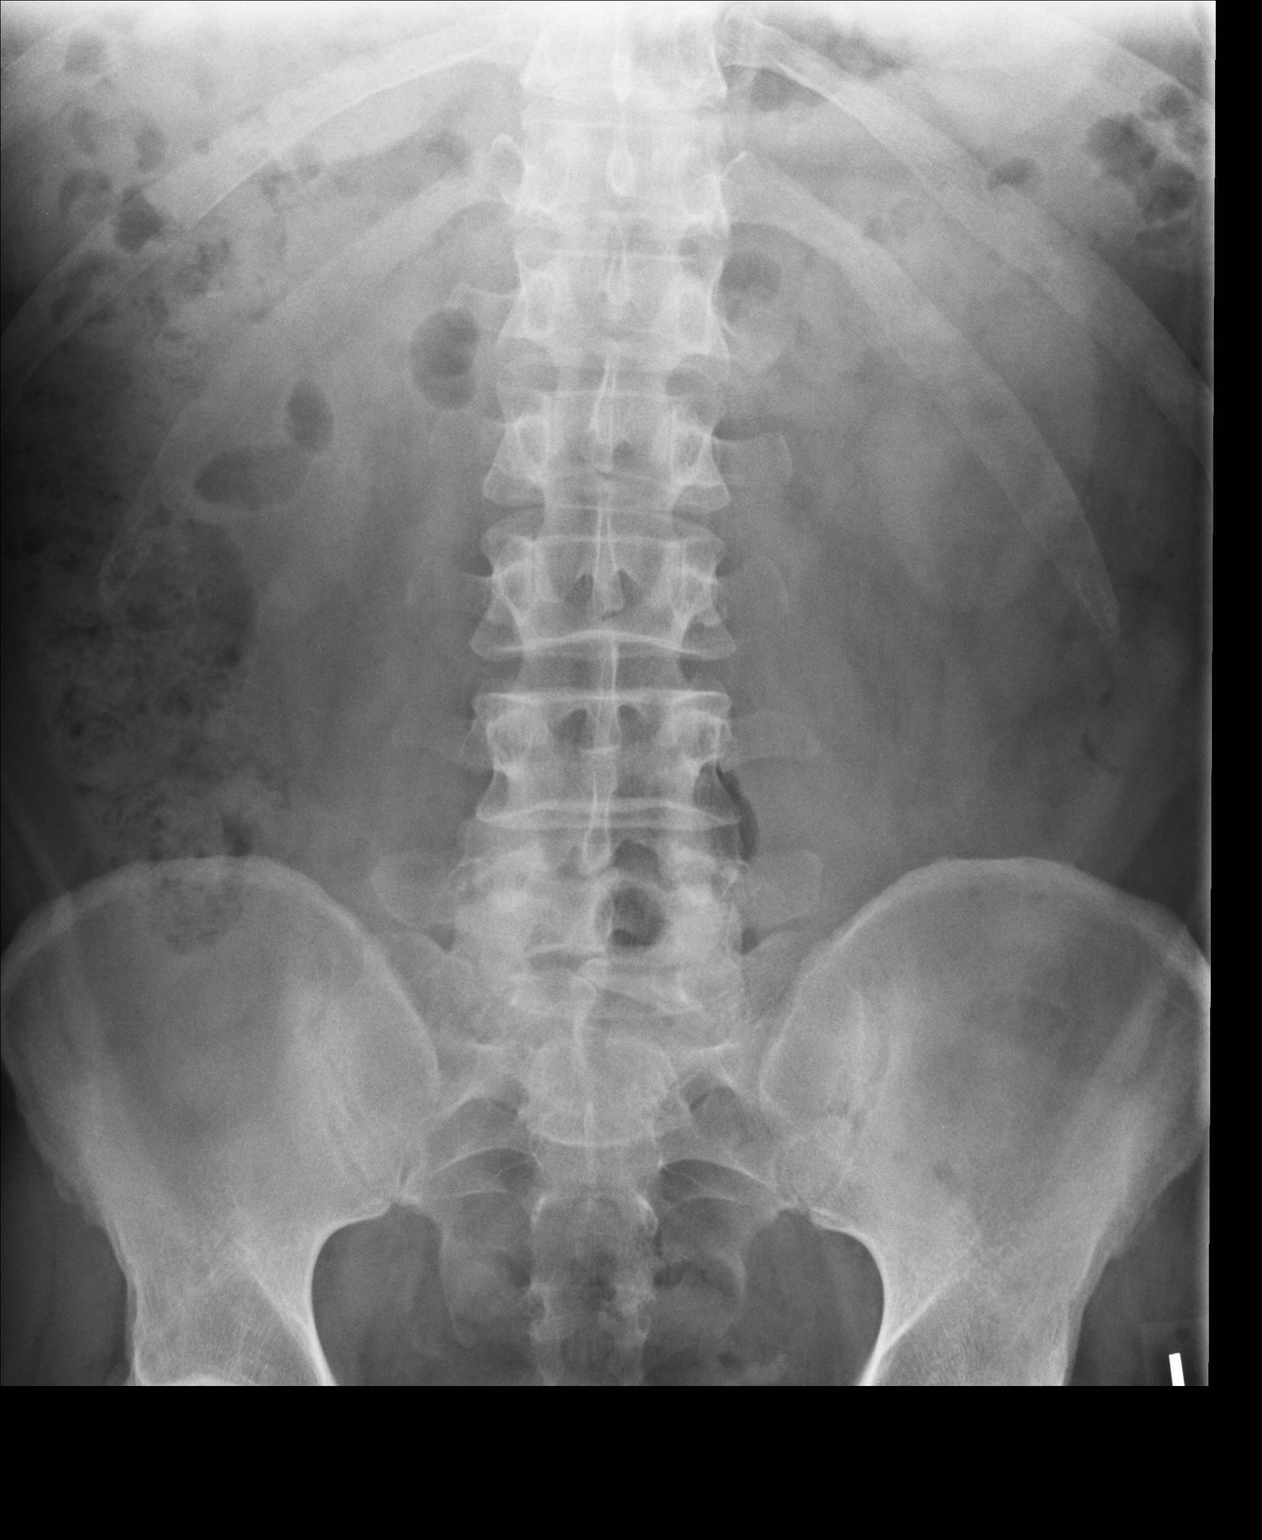

[1 of 1 positions shown; findings below may reference images not displayed]

FINDINGS: The bowel gas pattern is normal. No radio-opaque calculi or other
significant radiographic abnormality are seen.
IMPRESSION: Negative.

## 2014-10-06 NOTE — Progress Notes (Signed)
Never got done.  Too late to be helpful now.

## 2015-08-22 ENCOUNTER — Emergency Department (HOSPITAL_COMMUNITY)
Admission: EM | Admit: 2015-08-22 | Discharge: 2015-08-23 | Disposition: A | Discharge status: Home / Self Care | Payer: Self-pay | Attending: Emergency Medicine | Admitting: Emergency Medicine

## 2015-08-22 ENCOUNTER — Encounter (HOSPITAL_COMMUNITY): Payer: Self-pay | Admitting: *Deleted

## 2015-08-22 ENCOUNTER — Emergency Department (HOSPITAL_COMMUNITY): Payer: Self-pay

## 2015-08-22 DIAGNOSIS — X509XXA Other and unspecified overexertion or strenuous movements or postures, initial encounter: Secondary | ICD-10-CM | POA: Insufficient documentation

## 2015-08-22 DIAGNOSIS — Z79899 Other long term (current) drug therapy: Secondary | ICD-10-CM | POA: Insufficient documentation

## 2015-08-22 DIAGNOSIS — E785 Hyperlipidemia, unspecified: Secondary | ICD-10-CM | POA: Insufficient documentation

## 2015-08-22 DIAGNOSIS — Y9389 Activity, other specified: Secondary | ICD-10-CM | POA: Insufficient documentation

## 2015-08-22 DIAGNOSIS — Y929 Unspecified place or not applicable: Secondary | ICD-10-CM | POA: Insufficient documentation

## 2015-08-22 DIAGNOSIS — Y999 Unspecified external cause status: Secondary | ICD-10-CM | POA: Insufficient documentation

## 2015-08-22 DIAGNOSIS — M79622 Pain in left upper arm: Secondary | ICD-10-CM | POA: Insufficient documentation

## 2015-08-22 MED ORDER — IBUPROFEN 600 MG PO TABS: 600.0000 mg | ORAL_TABLET | Freq: Four times a day (QID) | ORAL | Status: DC | PRN

## 2015-08-22 MED ORDER — HYDROCODONE-ACETAMINOPHEN 5-325 MG PO TABS: 1.0000 | ORAL_TABLET | ORAL | Status: DC | PRN

## 2015-08-22 MED ORDER — IBUPROFEN 400 MG PO TABS
800.0000 mg | ORAL_TABLET | Freq: Once | ORAL | Status: AC
  Administered 2015-08-22: 800 mg via ORAL
  Filled 2015-08-22: qty 2

## 2015-08-22 MED ORDER — HYDROCODONE-ACETAMINOPHEN 5-325 MG PO TABS
1.0000 | ORAL_TABLET | Freq: Once | ORAL | Status: AC
  Administered 2015-08-22: 1 via ORAL
  Filled 2015-08-22: qty 1

## 2015-08-22 NOTE — ED Provider Notes (Signed)
CSN: [id number]     Arrival date & time 08/22/15  2052 History  By signing my name below, I, Bridgette HabermannMaria Tan, attest that this documentation has been prepared under the direction and in the presence of General MillsBenjamin Jovan Colligan, PA-C. Electronically Signed: Bridgette HabermannMaria Tan, ED Scribe. 08/22/2015. 11:06 PM.   Chief Complaint  Patient presents with  . Shoulder Injury   The history is provided by the patient. No language interpreter was used.   HPI Comments: Shawn BourbonOctavio Rolph is a 45 y.o. male who presents to the Emergency Department complaining of Sudden onset, constant left shoulder pain s/p mechanical injury around 8pm today. Pt states he was lifting a  log and it twisted backward and onto his left shoulder. Pt reports he heard a tear or pop. Pt also has associated neck pain. Per pt, pain is exacerbated when lifting his left arm. No alleviating factors noted. Pt denies head injury or additional injuries. Pt is right hand dominant. Pt is not on blood thinners. Pt denies numbness and paresthesia.   Past Medical History  Diagnosis Date  . GERD (gastroesophageal reflux disease)   . Hyperlipidemia    History reviewed. No pertinent past surgical history. Family History  Problem Relation Age of Onset  . Hyperlipidemia Mother   . Hypertension Mother   . Hyperlipidemia Sister    Social History  Substance Use Topics  . Smoking status: Never Smoker   . Smokeless tobacco: None  . Alcohol Use: No    Review of Systems A complete 10 system review of systems was obtained and all systems are negative except as noted in the HPI and PMH.   Allergies  Review of patient's allergies indicates no known allergies.  Home Medications   Prior to Admission medications   Medication Sig Start Date End Date Taking? Authorizing Provider  HYDROcodone-acetaminophen (NORCO/VICODIN) 5-325 MG tablet Take 1-2 tablets by mouth every 4 (four) hours as needed. 08/22/15   Joycie PeekBenjamin Berl Bonfanti, PA-C  ibuprofen (ADVIL,MOTRIN) 600 MG tablet Take 1  tablet (600 mg total) by mouth every 6 (six) hours as needed. 08/22/15   Kamryn Gauthier, PA-C   BP 154/91 mmHg  Pulse 78  Temp(Src) 98.4 F (36.9 C) (Oral)  Resp 18  Ht 5\' 8"  (1.727 m)  Wt 85.9 kg  BMI 28.80 kg/m2  SpO2 99% Physical Exam  Constitutional: He appears well-developed and well-nourished.  HENT:  Head: Normocephalic.  Eyes: Conjunctivae are normal.  Neck: Normal range of motion.  No focal bony tenderness.  Cardiovascular: Normal rate.   Pulmonary/Chest: Effort normal. No respiratory distress.  Abdominal: He exhibits no distension.  Musculoskeletal: Normal range of motion.  Tenderness diffusely throughout anterior left shoulder, cc and before meals joint, anterior and lateral left upper arm. Full range of motion of left elbow. Good range of motion with internal and external rotation and left shoulder, unable to adduct or abduct left shoulder. Negative urine medicines. No appreciable bony abnormalities. Distal pulses are Brisk cap refill. Grip strength intact and equal bilaterally. Sensation intact to light touch.  Neurological: He is alert.  Skin: Skin is warm and dry.  Psychiatric: He has a normal mood and affect. His behavior is normal.  Nursing note and vitals reviewed.   ED Course  Procedures  DIAGNOSTIC STUDIES: Oxygen Saturation is 99% on RA, normal by my interpretation.    COORDINATION OF CARE: 11:06 PM Discussed treatment plan with pt at bedside which includes sling installation and pt agreed to plan.  Imaging Review Dg Shoulder Left  08/22/2015  CLINICAL DATA:  Left shoulder pain EXAM: LEFT SHOULDER - 2+ VIEW COMPARISON:  None. FINDINGS: There is no evidence of fracture or dislocation. There is no evidence of arthropathy or other focal bone abnormality. Soft tissues are unremarkable. IMPRESSION: Negative left shoulder radiographs. Electronically Signed   By: Marin Roberts M.D.   On: 08/22/2015 21:26   I have personally reviewed and evaluated these  images as part of my medical decision-making.   MDM  Patient with left upper arm pain onset today after picking up and lifting a heavy object. He remains neurovascularly intact. X-rays are negative for any fractures or dislocations. Placed in arm sling, given pain medicines and referral to orthopedics for follow-up. Discussed strict return precautions. He verbalizes understanding, agrees with plan and subsequent discharge. Final diagnoses:  Left upper arm pain    I personally performed the services described in this documentation, which was scribed in my presence. The recorded information has been reviewed and is accurate.    Joycie Peek, PA-C 08/22/15 2353  Bethann Berkshire, MD 08/23/15 [phone]

## 2015-08-22 NOTE — Discharge Instructions (Signed)
Please take your medications as prescribed and as we discussed. Do not take your Norco before driving or operating machinery as it can make him very drowsy. Follow-up with orthopedics, Dr. Melvyn Novasrtmann for reevaluation. Return to ED for new or worsening symptoms as we discussed.  Dolor msculoesqueltico (Musculoskeletal Pain) El dolor musculoesqueltico se siente en huesos y msculos. El dolor puede ocurrir en cualquier parte del cuerpo. El profesional que lo asiste podr tratarlo sin Geologist, engineeringconocer la causa del dolor. Lo tratar Time Warneraunque las pruebas de laboratorio (sangre y [place]), las radiografas y otros estudios sean normales. La causa de estos dolores puede ser un virus.  CAUSAS Generalmente no existe una causa definida para este trastorno. Tambin el Citigroupmalestar puede deberse a la [place]actividad excesiva. En la actividad excesiva se incluye el hacer ejercicios fsicos muy intensos cuando no se est en buena forma. El dolor de huesos tambin puede deberse a cambios climticos. Los huesos son sensibles a los cambios en la presin atmosfrica. INSTRUCCIONES PARA EL CUIDADO DOMICILIARIO  Para proteger su privacidad, no se entregarn los The Sherwin-Williamsresultados de las pruebas por telfono. Asegrese de conseguirlos. Consulte el modo en que podr obtenerlos si no se lo han informado. Es su responsabilidad contar con los Lubrizol Corporationresultados de las pruebas.  Utilice los medicamentos de venta libre o de prescripcin para Chief Technology Officerel dolor, Environmental health practitionerel malestar o la [place] Springsfiebre, segn se lo indique el profesional que lo asiste. Si le han administrado medicamentos, no conduzca, no opere maquinarias ni Diplomatic Services operational officerherramientas elctricas, y tampoco firme documentos legales durante 24 horas. No beba alcohol. No tome pldoras para dormir ni otros medicamentos que Museum/gallery curatorpuedan interferir en el tratamiento.  Podr seguir con todas las actividades a menos que stas le ocasionen ms Merck & Codolor. Cuando el dolor disminuya, es importante que gradualmente reanude toda la rutina habitual. Retome las  actividades comenzando lentamente. Aumente gradualmente la intensidad y la duracin de sus actividades o del ejercicio.  Durante los perodos de dolor intenso, el reposo en cama puede ser beneficioso. Recustese o sintese en la posicin que le sea ms cmoda.  Coloque hielo sobre la zona afectada.  Ponga hielo en Lucile Shuttersuna bolsa.  Colquese una toalla entre la piel y la bolsa de hielo.  Aplique el hielo durante 10 a 20 minutos 3  4 veces por da.  Si el dolor empeora, o no desaparece puede ser Northeast Utilitiesnecesario repetir las pruebas o Education officer, environmentalrealizar nuevos exmenes. El profesional que lo asiste podr requerir investigar ms profundamente para Veterinary surgeonencontrar la causa posible. SOLICITE ATENCIN MDICA DE INMEDIATO SI:  Siente que el dolor empeora y no se alivia con los medicamentos.  Siente dolor en el pecho asociado a falta de aire, sudoracin, nuseas o vmitos.  El dolor se localiza en el abdomen.  Comienza a sentir nuevos sntomas que parecen ser diferentes o que lo preocupan. ASEGRESE DE QUE:   Comprende las instrucciones para el alta mdica.  Controlar su enfermedad.  Solicitar atencin mdica de inmediato segn las indicaciones.   Esta informacin no tiene Theme park managercomo fin reemplazar el consejo del mdico. Asegrese de hacerle al mdico cualquier pregunta que tenga.   Document Released: 11/04/2004 Document Revised: 04/19/2011 Elsevier Interactive Patient Education [employer]2016 Elsevier Inc.

## 2015-08-22 NOTE — ED Notes (Signed)
PA at bedside.

## 2015-08-22 NOTE — ED Notes (Signed)
The pt is c/o lr shoulder pain.  He was lifting a 150lb log and it twisted backward and onto his lt shoulder approx 2 hours ago.  He is unable to lift  His lt arm withoout pain

## 2015-08-23 NOTE — ED Notes (Signed)
Patient and his wife both verbalized understanding of discharge instructions and deny any further needs or questions at this time. VS stable. Patient ambulatory with steady gait.

## 2017-08-05 IMAGING — DX DG SHOULDER 2+V*L*
3 series · 3 of 3 positions shown · non-contrast
Comparison: None.

CLINICAL DATA: Left shoulder pain

EXAM:
LEFT SHOULDER - 2+ VIEW

[w shoulder internal left]
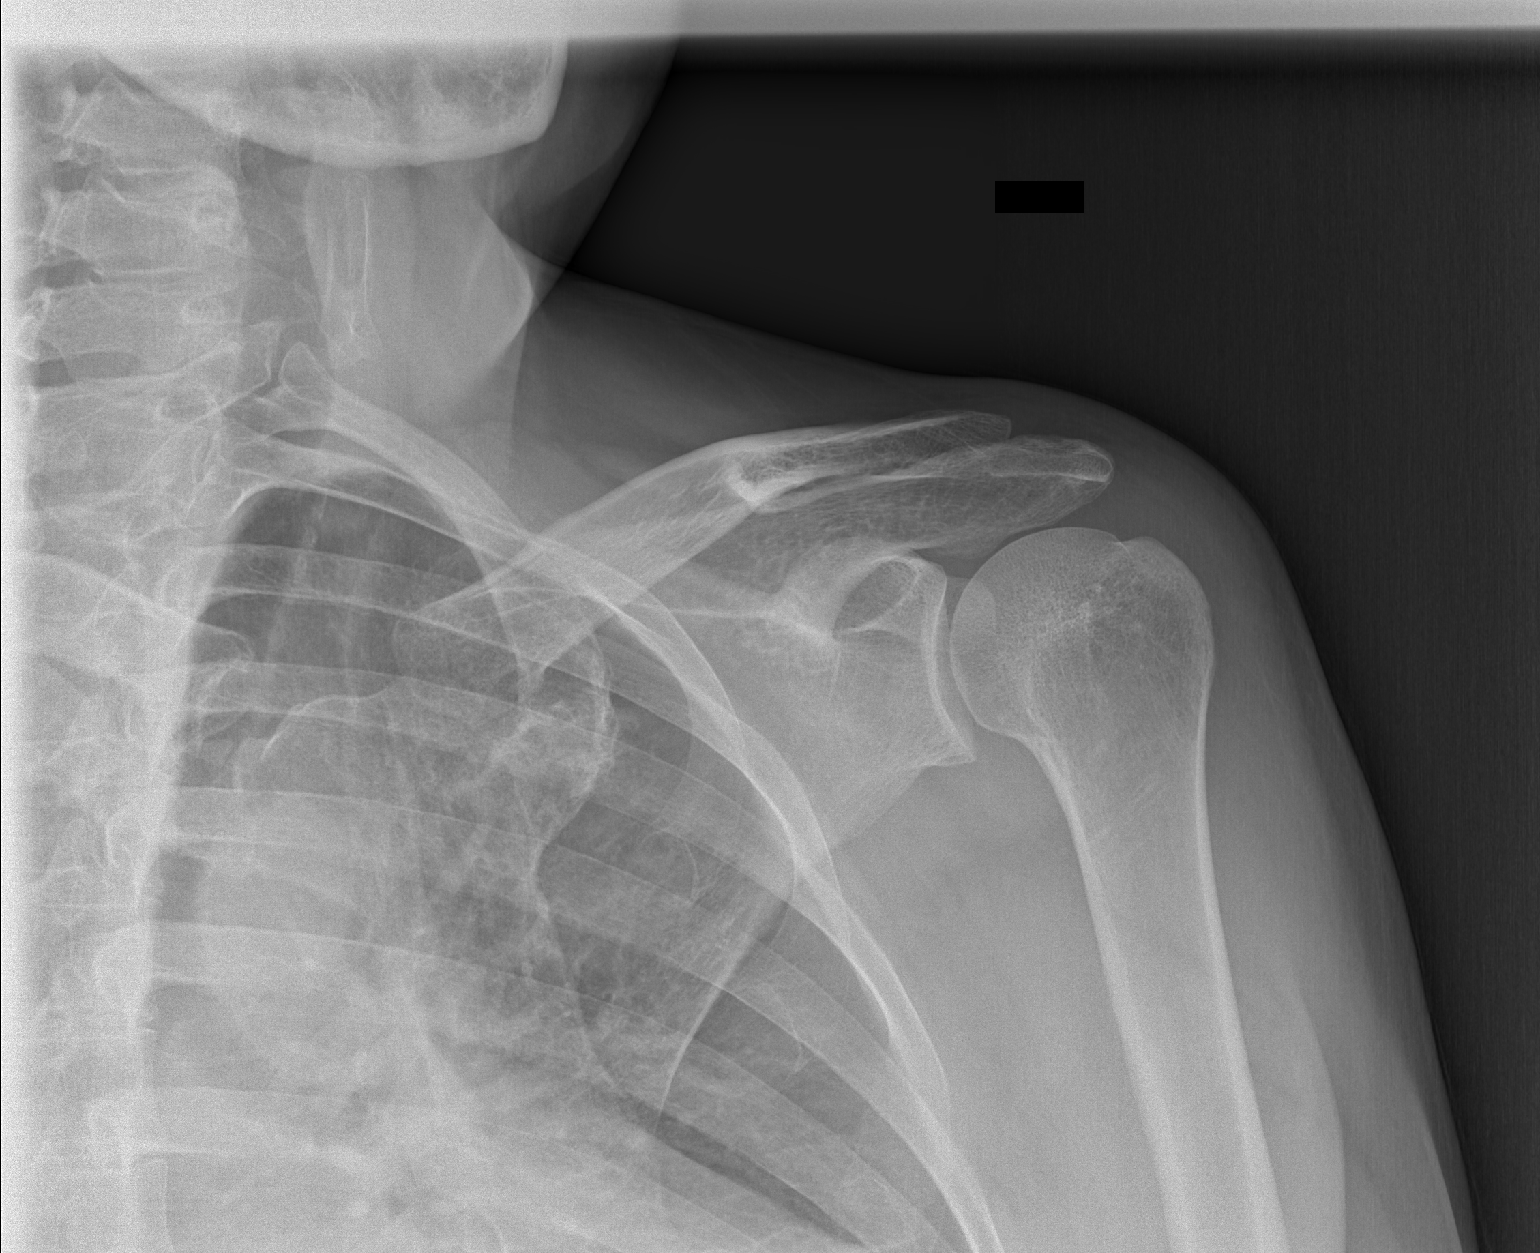

[w shoulder y-view left]
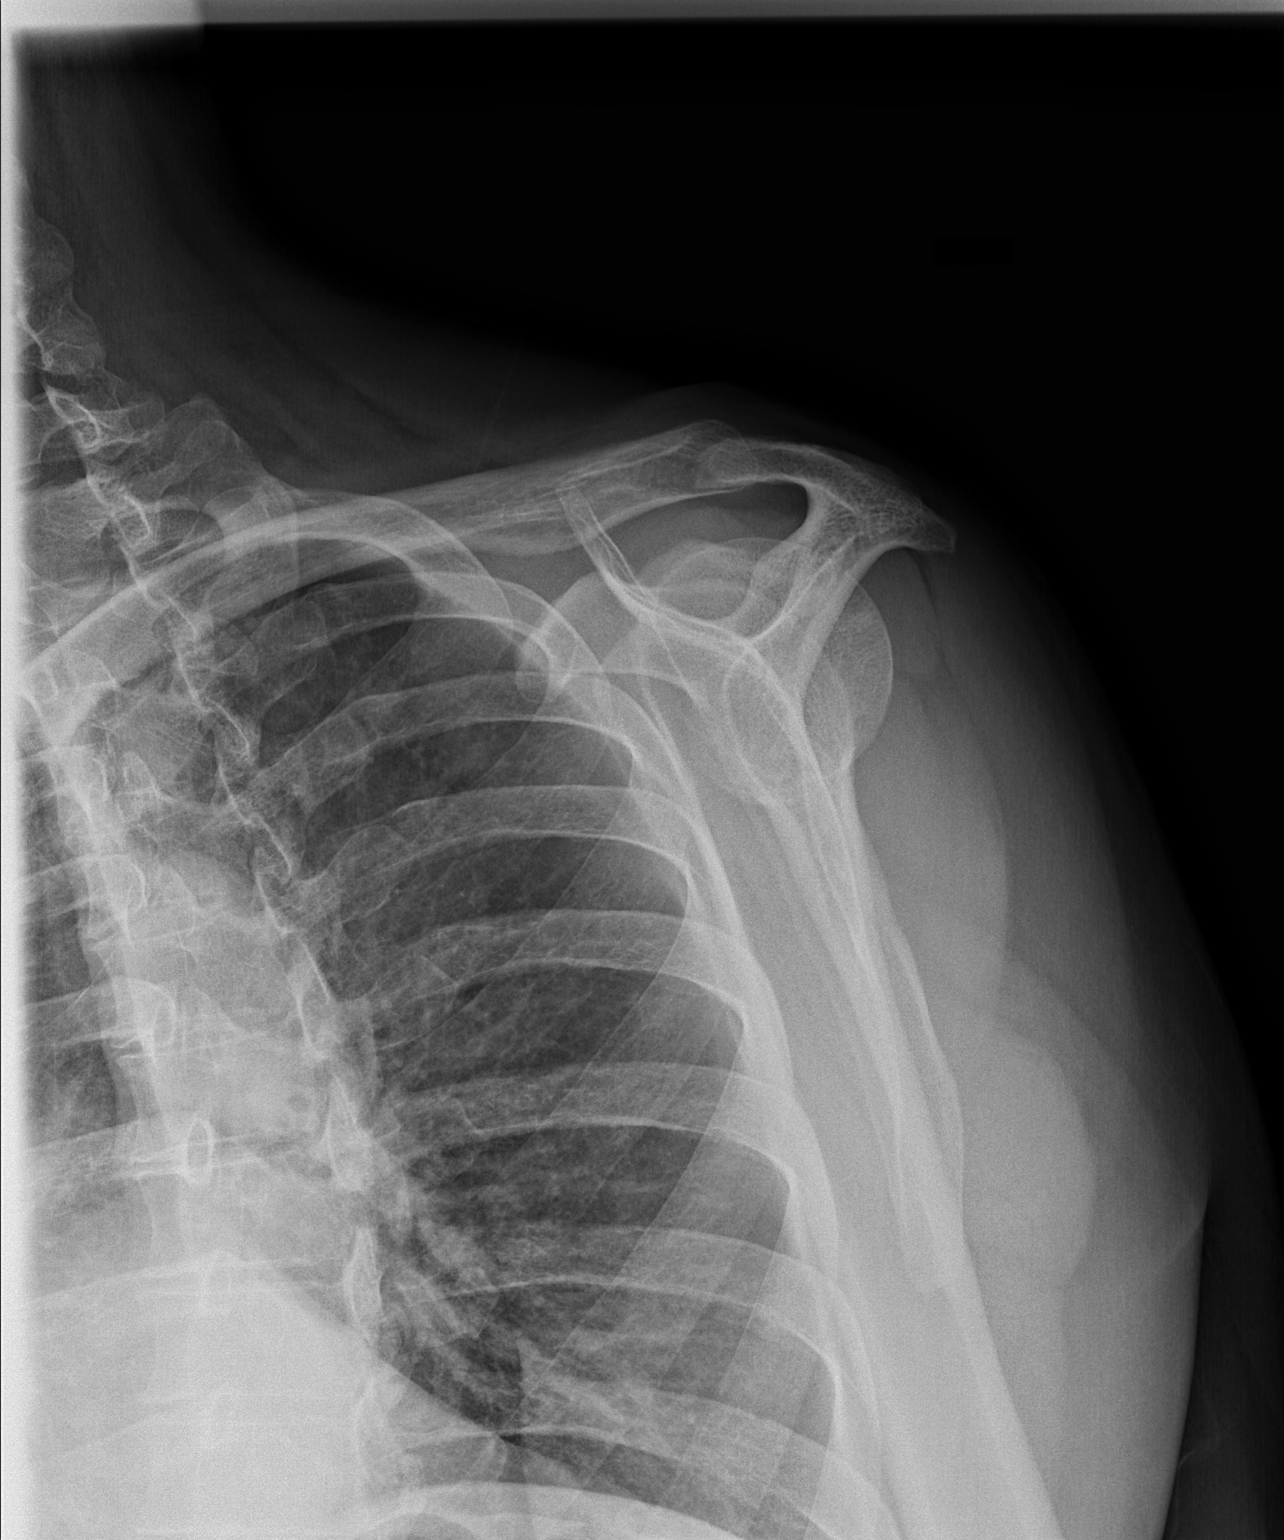

[x shoulder axillary left]
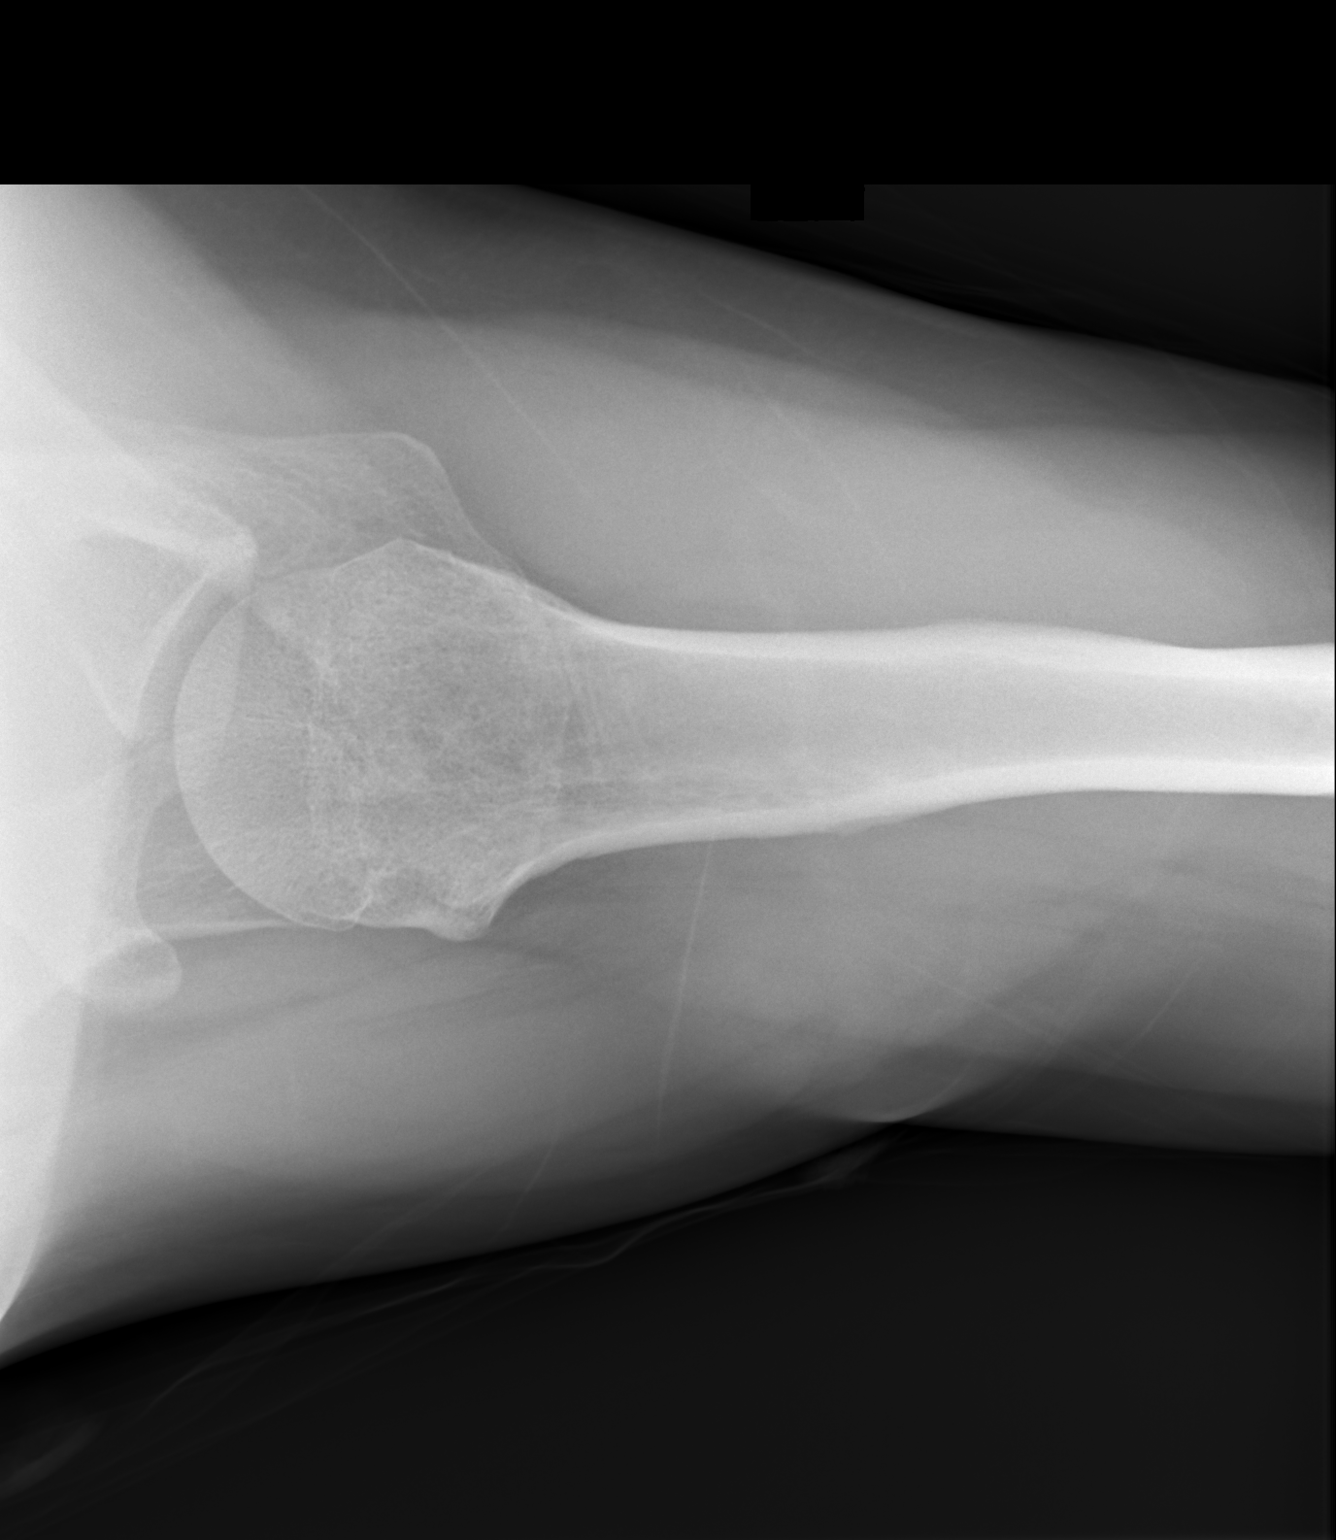

[3 of 3 positions shown; findings below may reference images not displayed]

FINDINGS: There is no evidence of fracture or dislocation. There is no
evidence of arthropathy or other focal bone abnormality. Soft
tissues are unremarkable.
IMPRESSION: Negative left shoulder radiographs.

## 2018-02-08 HISTORY — PX: CATARACT EXTRACTION: SUR2

## 2019-04-09 DIAGNOSIS — H11002 Unspecified pterygium of left eye: Secondary | ICD-10-CM | POA: Insufficient documentation

## 2019-05-17 ENCOUNTER — Ambulatory Visit: Payer: Medicaid Other | Attending: Internal Medicine

## 2019-05-17 DIAGNOSIS — Z23 Encounter for immunization: Secondary | ICD-10-CM

## 2019-05-17 NOTE — Progress Notes (Signed)
   Covid-19 Vaccination Clinic  Name:  Shawn French    MRN: [id number] DOB: [birth year]  05/17/2019  Mr. Shawn French was observed post Covid-19 immunization for 15 minutes without incident. He was provided with Vaccine Information Sheet and instruction to access the V-Safe system.   Mr. Shawn French was instructed to call 911 with any severe reactions post vaccine: Marland Kitchen Difficulty breathing  . Swelling of face and throat  . A fast heartbeat  . A bad rash all over body  . Dizziness and weakness   Immunizations Administered    Name Date Dose VIS Date Route   Pfizer COVID-19 Vaccine 05/17/2019 11:37 AM 0.3 mL 01/19/2019 Intramuscular   Manufacturer: [employer], [employer]   Lot: [id number]   NDC: [address]-4

## 2019-06-11 ENCOUNTER — Ambulatory Visit: Payer: Medicaid Other | Attending: Internal Medicine

## 2019-06-11 DIAGNOSIS — Z23 Encounter for immunization: Secondary | ICD-10-CM

## 2019-06-11 NOTE — Progress Notes (Signed)
   Covid-19 Vaccination Clinic  Name:  Shawn French    MRN: [id number] DOB: 01-10-1971  06/11/2019  Mr. Shawn French was observed post Covid-19 immunization for 15 minutes without incident. He was provided with Vaccine Information Sheet and instruction to access the V-Safe system.   Mr. Shawn French was instructed to call 911 with any severe reactions post vaccine: Marland Kitchen Difficulty breathing  . Swelling of face and throat  . A fast heartbeat  . A bad rash all over body  . Dizziness and weakness   Immunizations Administered    Name Date Dose VIS Date Route   Pfizer COVID-19 Vaccine 06/11/2019 11:45 AM 0.3 mL 04/04/2018 Intramuscular   Manufacturer: [employer], [employer]   Lot: [id number]   NDC: [address]-3

## 2019-06-12 ENCOUNTER — Ambulatory Visit: Payer: Medicaid Other

## 2019-08-09 DIAGNOSIS — Z419 Encounter for procedure for purposes other than remedying health state, unspecified: Secondary | ICD-10-CM | POA: Diagnosis not present

## 2019-09-09 DIAGNOSIS — Z419 Encounter for procedure for purposes other than remedying health state, unspecified: Secondary | ICD-10-CM | POA: Diagnosis not present

## 2019-10-10 DIAGNOSIS — Z419 Encounter for procedure for purposes other than remedying health state, unspecified: Secondary | ICD-10-CM | POA: Diagnosis not present

## 2019-11-09 DIAGNOSIS — Z419 Encounter for procedure for purposes other than remedying health state, unspecified: Secondary | ICD-10-CM | POA: Diagnosis not present

## 2019-12-10 DIAGNOSIS — Z419 Encounter for procedure for purposes other than remedying health state, unspecified: Secondary | ICD-10-CM | POA: Diagnosis not present

## 2020-01-09 DIAGNOSIS — Z419 Encounter for procedure for purposes other than remedying health state, unspecified: Secondary | ICD-10-CM | POA: Diagnosis not present

## 2020-02-09 DIAGNOSIS — Z419 Encounter for procedure for purposes other than remedying health state, unspecified: Secondary | ICD-10-CM | POA: Diagnosis not present

## 2020-03-11 DIAGNOSIS — Z419 Encounter for procedure for purposes other than remedying health state, unspecified: Secondary | ICD-10-CM | POA: Diagnosis not present

## 2020-04-08 DIAGNOSIS — Z419 Encounter for procedure for purposes other than remedying health state, unspecified: Secondary | ICD-10-CM | POA: Diagnosis not present

## 2020-04-18 ENCOUNTER — Ambulatory Visit (HOSPITAL_COMMUNITY)
Admission: EM | Admit: 2020-04-18 | Discharge: 2020-04-18 | Disposition: A | Discharge status: Home / Self Care | Payer: Medicaid Other

## 2020-04-18 ENCOUNTER — Ambulatory Visit (INDEPENDENT_AMBULATORY_CARE_PROVIDER_SITE_OTHER): Payer: Medicaid Other

## 2020-04-18 ENCOUNTER — Encounter (HOSPITAL_COMMUNITY): Payer: Self-pay

## 2020-04-18 ENCOUNTER — Other Ambulatory Visit: Payer: Self-pay

## 2020-04-18 DIAGNOSIS — S20212A Contusion of left front wall of thorax, initial encounter: Secondary | ICD-10-CM

## 2020-04-18 DIAGNOSIS — W11XXXA Fall on and from ladder, initial encounter: Secondary | ICD-10-CM | POA: Diagnosis not present

## 2020-04-18 DIAGNOSIS — R0781 Pleurodynia: Secondary | ICD-10-CM

## 2020-04-18 DIAGNOSIS — J9811 Atelectasis: Secondary | ICD-10-CM | POA: Diagnosis not present

## 2020-04-18 MED ORDER — NAPROXEN 500 MG PO TABS: 500.0000 mg | ORAL_TABLET | Freq: Two times a day (BID) | ORAL | 0 refills | Status: DC

## 2020-04-18 MED ORDER — TIZANIDINE HCL 4 MG PO TABS: 4.0000 mg | ORAL_TABLET | Freq: Four times a day (QID) | ORAL | 0 refills | Status: DC | PRN

## 2020-04-18 NOTE — ED Provider Notes (Addendum)
[facility]    CSN: [id number] Arrival date & time: 04/18/20  1823      History   Chief Complaint Chief Complaint  Patient presents with  . Fall  . rib cage pain     HPI Shawn French is a 50 y.o. male. Presenting for back pain. History GERD, hyperlipidemia.  States he fell from a 5 foot ladder 3 days ago, landing directly on the left side of his rib cage.  Since then he has had rib cage pain with sneezing, breathing, moving.  Some relief with Tylenol.  Adamantly denies injury of other joint or body part.  Denies shortness of breath, abdominal pain. denies change in bowel or bladder function.  Denies head trauma, loss of consciousness, headaches, dizziness, vision changes. Denies left-sided chest pain at rest. Feeling well otherwise.  HPI  Past Medical History:  Diagnosis Date  . GERD (gastroesophageal reflux disease)   . Hyperlipidemia     Patient Active Problem List   Diagnosis Date Noted  . Fatigue 05/09/2012  . Leg cramps 11/17/2010  . Bruising, spontaneous 11/17/2010  . Familial hyperlipidemia 11/17/2010  . Obesity 11/17/2010    History reviewed. No pertinent surgical history.     Home Medications    Prior to Admission medications   Medication Sig Start Date End Date Taking? Authorizing Provider  acetaminophen (TYLENOL) 160 MG/5ML liquid Take by mouth every 4 (four) hours as needed for fever.   Yes [provider]  naproxen (NAPROSYN) 500 MG tablet Take 1 tablet (500 mg total) by mouth 2 (two) times daily. 04/18/20  Yes Rhys Martini, PA-C  tiZANidine (ZANAFLEX) 4 MG tablet Take 1 tablet (4 mg total) by mouth every 6 (six) hours as needed for muscle spasms. 04/18/20  Yes Rhys Martini, PA-C  HYDROcodone-acetaminophen (NORCO/VICODIN) 5-325 MG tablet Take 1-2 tablets by mouth every 4 (four) hours as needed. 08/22/15   Cartner, Sharlet Salina, PA-C  ibuprofen (ADVIL,MOTRIN) 600 MG tablet Take 1 tablet (600 mg total) by mouth every 6 (six) hours as  needed. 08/22/15   Joycie Peek, PA-C    Family History Family History  Problem Relation Age of Onset  . Hyperlipidemia Mother   . Hypertension Mother   . Hyperlipidemia Sister     Social History Social History   Tobacco Use  . Smoking status: Never Smoker  . Smokeless tobacco: Never Used  Substance Use Topics  . Alcohol use: No     Allergies   Patient has no known allergies.   Review of Systems Review of Systems  Gastrointestinal: Negative for abdominal pain.  Musculoskeletal: Negative for back pain.       Left-sided rib pain  Neurological: Negative for syncope and headaches.  All other systems reviewed and are negative.    Physical Exam Triage Vital Signs ED Triage Vitals  Enc Vitals Group     BP      Pulse      Resp      Temp      Temp src      SpO2      Weight      Height      Head Circumference      Peak Flow      Pain Score      Pain Loc      Pain Edu?      Excl. in GC?    No data found.  Updated Vital Signs BP (!) 144/90 (BP Location: Right Arm)   Pulse  68   Temp 98.8 F (37.1 C) (Oral)   Resp 18   SpO2 97%   Visual Acuity Right Eye Distance:   Left Eye Distance:   Bilateral Distance:    Right Eye Near:   Left Eye Near:    Bilateral Near:     Physical Exam Vitals reviewed.  Constitutional:      Appearance: Normal appearance.  HENT:     Head: Normocephalic and atraumatic.     Nose: Nose normal.     Mouth/Throat:     Mouth: Mucous membranes are moist.     Pharynx: Oropharynx is clear.  Eyes:     Extraocular Movements: Extraocular movements intact.     Pupils: Pupils are equal, round, and reactive to light.  Cardiovascular:     Rate and Rhythm: Normal rate and regular rhythm.     Heart sounds: Normal heart sounds.  Pulmonary:     Effort: Pulmonary effort is normal.     Breath sounds: Normal breath sounds.  Abdominal:     General: Abdomen is flat. Bowel sounds are normal. There is no distension.     Palpations:  Abdomen is soft.     Tenderness: There is no abdominal tenderness. There is no right CVA tenderness, left CVA tenderness, guarding or rebound. Negative signs include Murphy's sign, Rovsing's sign and McBurney's sign.     Comments: Negative seatbelt sign. Absolutely no abdominal tenderness.  Musculoskeletal:     Cervical back: Normal, normal range of motion and neck supple. No swelling, deformity, signs of trauma, spasms, tenderness, bony tenderness or crepitus. No pain with movement.     Thoracic back: Normal. No swelling, deformity, signs of trauma, spasms, tenderness or bony tenderness.     Lumbar back: Normal. No swelling, deformity, lacerations, spasms, tenderness or bony tenderness. Negative right straight leg raise test and negative left straight leg raise test. No scoliosis.     Comments: Left distal lateral ribs with ecchymosis, tenderness. No deformity palpated. Tenderness elicited with inspiration and abduction L arm. Sensation intact.   Absolutely no other injury, deformity, ecchymosis, abrasion, tenderness. Gait normal.  Skin:    General: Skin is warm.     Capillary Refill: Capillary refill takes less than 2 seconds.  Neurological:     General: No focal deficit present.     Mental Status: He is alert and oriented to person, place, and time.  Psychiatric:        Mood and Affect: Mood normal.        Behavior: Behavior normal.        Thought Content: Thought content normal.        Judgment: Judgment normal.      UC Treatments / Results  Labs (all labs ordered are listed, but only abnormal results are displayed) Labs Reviewed - No data to display  EKG   Radiology DG Ribs Unilateral W/Chest Left  Result Date: 04/18/2020 CLINICAL DATA:  Larey Seat from a ladder 3 days ago. Left-sided posterior rib pain. EXAM: LEFT RIBS AND CHEST - 3+ VIEW COMPARISON:  A 09/27/2012 FINDINGS: The cardiac silhouette, mediastinal and hilar contours are within normal limits. Minimal streaky basilar  atelectasis but no infiltrates, effusions or pneumothorax. Dedicated left rib films do not demonstrate any definite acute left-sided rib fractures. No pleural thickening. IMPRESSION: Minimal streaky basilar atelectasis. No definite acute left-sided rib fractures. Electronically Signed   By: Rudie Meyer M.D.   On: 04/18/2020 19:35    Procedures Procedures (including critical care time)  Medications Ordered in UC Medications - No data to display  Initial Impression / Assessment and Plan / UC Course  I have reviewed the triage vital signs and the nursing notes.  Pertinent labs & imaging results that were available during my care of the patient were reviewed by me and considered in my medical decision making (see chart for details).      This patient is a 50 year old male presenting with left-sided rib pain following fall from ladder that occurred 3 days ago.   Xray L ribs- Minimal streaky basilar atelectasis. No definite acute left-sided rib fractures.  Films interpreted by myself and radiologist. I suspect atelectasis is due to shallow inspirations for the last 3 days, due to pain.   Rec f/u with PCP in 2-3 days for recheck. ED if symptoms worsen before then. Return precautions discussed.  This chart was dictated using voice recognition software, Dragon. Despite the best efforts of this provider to proofread and correct errors, errors may still occur which can change documentation meaning.  Final Clinical Impressions(s) / UC Diagnoses   Final diagnoses:  Fall from ladder, initial encounter  Contusion of rib on left side, initial encounter  Atelectasis     Discharge Instructions     -Take the muscle relaxer- Zanaflex (Tizanidine), up to 3x daily for muscle spasm and pain. This can make you drowsy, so take when you don't have to drive or operate machinery. -Also try the naproxen for pain. You can take this twice daily with food. You can keep taking tylenol while on this  medication. -Try heat or ice your your ribs. Neither is better, but one might provide you with more relief.  -If your pain gets worse over the weekend; if you develop shortness of breath; chest pain; abdominal pain; changes in bowel or bladder function- head straight to the ED. -If you are still in significant pain on Monday, please follow-up with PCP for further evaluation and management.    ED Prescriptions    Medication Sig Dispense Auth. Provider   tiZANidine (ZANAFLEX) 4 MG tablet Take 1 tablet (4 mg total) by mouth every 6 (six) hours as needed for muscle spasms. 30 tablet Rhys Martini, PA-C   naproxen (NAPROSYN) 500 MG tablet Take 1 tablet (500 mg total) by mouth 2 (two) times daily. 30 tablet Rhys Martini, PA-C     I have reviewed the PDMP during this encounter.   Rhys Martini, PA-C 04/19/20 1013    Rhys Martini, PA-C 04/19/20 1013

## 2020-04-18 NOTE — Discharge Instructions (Addendum)
-  Take the muscle relaxer- Zanaflex (Tizanidine), up to 3x daily for muscle spasm and pain. This can make you drowsy, so take when you don't have to drive or operate machinery. -Also try the naproxen for pain. You can take this twice daily with food. You can keep taking tylenol while on this medication. -Try heat or ice your your ribs. Neither is better, but one might provide you with more relief.  -If your pain gets worse over the weekend; if you develop shortness of breath; chest pain; abdominal pain; changes in bowel or bladder function- head straight to the ED. -If you are still in significant pain on Monday, please follow-up with PCP for further evaluation and management.

## 2020-04-18 NOTE — ED Triage Notes (Signed)
Pt reports he fell on the ground from a ladder 5 ft 3 days ago. Pt reports rib cage pain when sneezing and taking deep breath. Tylenol gives some relief.

## 2020-05-09 DIAGNOSIS — Z419 Encounter for procedure for purposes other than remedying health state, unspecified: Secondary | ICD-10-CM | POA: Diagnosis not present

## 2020-06-08 DIAGNOSIS — Z419 Encounter for procedure for purposes other than remedying health state, unspecified: Secondary | ICD-10-CM | POA: Diagnosis not present

## 2020-07-09 DIAGNOSIS — Z419 Encounter for procedure for purposes other than remedying health state, unspecified: Secondary | ICD-10-CM | POA: Diagnosis not present

## 2020-08-08 DIAGNOSIS — Z419 Encounter for procedure for purposes other than remedying health state, unspecified: Secondary | ICD-10-CM | POA: Diagnosis not present

## 2020-09-08 DIAGNOSIS — Z419 Encounter for procedure for purposes other than remedying health state, unspecified: Secondary | ICD-10-CM | POA: Diagnosis not present

## 2020-10-09 DIAGNOSIS — Z419 Encounter for procedure for purposes other than remedying health state, unspecified: Secondary | ICD-10-CM | POA: Diagnosis not present

## 2020-11-08 DIAGNOSIS — Z419 Encounter for procedure for purposes other than remedying health state, unspecified: Secondary | ICD-10-CM | POA: Diagnosis not present

## 2020-12-09 DIAGNOSIS — Z419 Encounter for procedure for purposes other than remedying health state, unspecified: Secondary | ICD-10-CM | POA: Diagnosis not present

## 2021-01-08 DIAGNOSIS — Z419 Encounter for procedure for purposes other than remedying health state, unspecified: Secondary | ICD-10-CM | POA: Diagnosis not present

## 2021-02-08 DIAGNOSIS — Z419 Encounter for procedure for purposes other than remedying health state, unspecified: Secondary | ICD-10-CM | POA: Diagnosis not present

## 2021-03-11 DIAGNOSIS — Z419 Encounter for procedure for purposes other than remedying health state, unspecified: Secondary | ICD-10-CM | POA: Diagnosis not present

## 2021-04-08 DIAGNOSIS — Z419 Encounter for procedure for purposes other than remedying health state, unspecified: Secondary | ICD-10-CM | POA: Diagnosis not present

## 2021-05-09 DIAGNOSIS — Z419 Encounter for procedure for purposes other than remedying health state, unspecified: Secondary | ICD-10-CM | POA: Diagnosis not present

## 2021-06-08 DIAGNOSIS — Z419 Encounter for procedure for purposes other than remedying health state, unspecified: Secondary | ICD-10-CM | POA: Diagnosis not present

## 2021-07-09 DIAGNOSIS — Z419 Encounter for procedure for purposes other than remedying health state, unspecified: Secondary | ICD-10-CM | POA: Diagnosis not present

## 2021-08-08 DIAGNOSIS — Z419 Encounter for procedure for purposes other than remedying health state, unspecified: Secondary | ICD-10-CM | POA: Diagnosis not present

## 2021-09-08 DIAGNOSIS — Z419 Encounter for procedure for purposes other than remedying health state, unspecified: Secondary | ICD-10-CM | POA: Diagnosis not present

## 2021-10-06 ENCOUNTER — Encounter (HOSPITAL_COMMUNITY): Payer: Self-pay | Admitting: *Deleted

## 2021-10-06 ENCOUNTER — Ambulatory Visit (HOSPITAL_COMMUNITY)
Admission: EM | Admit: 2021-10-06 | Discharge: 2021-10-06 | Disposition: A | Discharge status: Home / Self Care | Payer: Medicaid Other | Attending: Emergency Medicine | Admitting: Emergency Medicine

## 2021-10-06 ENCOUNTER — Ambulatory Visit (INDEPENDENT_AMBULATORY_CARE_PROVIDER_SITE_OTHER): Payer: Medicaid Other

## 2021-10-06 DIAGNOSIS — M25572 Pain in left ankle and joints of left foot: Secondary | ICD-10-CM

## 2021-10-06 DIAGNOSIS — R600 Localized edema: Secondary | ICD-10-CM | POA: Diagnosis not present

## 2021-10-06 NOTE — ED Triage Notes (Signed)
Pt fell off a 41ft ladder today and twisted his left ankle.

## 2021-10-06 NOTE — Discharge Instructions (Addendum)
X-rays negative for fracture, symptoms will improve with time  Take ibuprofen 800 mg every 8 hours for 5 days to help reduce inflammation and manage pain, may take Tylenol in addition to this  May use ice over the bruised area in 10 to 15-minute intervals to help reduce swelling and for general comfort, may switch over to heat after 48 hours  Follow-up with orthopedic specialist if your pain continues past 2 weeks  Radiografas negativas para fractura, los sntomas mejorarn con el [employer] ibuprofeno 800 mg cada 8 horas durante 5 das para ayudar a reducir la inflamacin y Human resources officer; puede tomar Tylenol adems de esto.  Puede usar hielo sobre el rea magullada en intervalos de 10 a 15 minutos para ayudar a Building services engineer y para comodidad general, puede cambiar a calor despus de 48 horas.  Haga un seguimiento con un especialista en ortopedia si su dolor contina despus de 2 semanas.

## 2021-10-06 NOTE — ED Provider Notes (Signed)
[facility]    CSN: [id number] Arrival date & time: 10/06/21  1912      History   Chief Complaint Chief Complaint  Patient presents with   Ankle Pain    HPI Shawn French is a 51 y.o. male.   Patient presents with left ankle pain and swelling beginning today after fall from a 4 foot ladder.  Endorses that ankle rotated externally and and has been painful to bear weight since.  Range of motion is intact but pain is elicited with rotation.  Endorses a numbness to the heel of the foot.  Has not attempted treatment.  No prior injury or trauma.    Past Medical History:  Diagnosis Date   GERD (gastroesophageal reflux disease)    Hyperlipidemia     Patient Active Problem List   Diagnosis Date Noted   Fatigue 05/09/2012   Leg cramps 11/17/2010   Bruising, spontaneous 11/17/2010   Familial hyperlipidemia 11/17/2010   Obesity 11/17/2010    History reviewed. No pertinent surgical history.     Home Medications    Prior to Admission medications   Medication Sig Start Date End Date Taking? Authorizing Provider  acetaminophen (TYLENOL) 160 MG/5ML liquid Take by mouth every 4 (four) hours as needed for fever.    [provider]  HYDROcodone-acetaminophen (NORCO/VICODIN) 5-325 MG tablet Take 1-2 tablets by mouth every 4 (four) hours as needed. 08/22/15   Cartner, Sharlet Salina, PA-C  ibuprofen (ADVIL,MOTRIN) 600 MG tablet Take 1 tablet (600 mg total) by mouth every 6 (six) hours as needed. 08/22/15   Cartner, Sharlet Salina, PA-C  naproxen (NAPROSYN) 500 MG tablet Take 1 tablet (500 mg total) by mouth 2 (two) times daily. 04/18/20   Rhys Martini, PA-C  tiZANidine (ZANAFLEX) 4 MG tablet Take 1 tablet (4 mg total) by mouth every 6 (six) hours as needed for muscle spasms. 04/18/20   Rhys Martini, PA-C    Family History Family History  Problem Relation Age of Onset   Hyperlipidemia Mother    Hypertension Mother    Hyperlipidemia Sister     Social History Social  History   Tobacco Use   Smoking status: Never   Smokeless tobacco: Never  Substance Use Topics   Alcohol use: No     Allergies   Patient has no known allergies.   Review of Systems Review of Systems  Constitutional: Negative.   Respiratory: Negative.    Cardiovascular: Negative.   Musculoskeletal:  Positive for gait problem and myalgias. Negative for arthralgias, back pain, joint swelling, neck pain and neck stiffness.  Skin: Negative.      Physical Exam Triage Vital Signs ED Triage Vitals  Enc Vitals Group     BP 10/06/21 2014 (!) 151/90     Pulse Rate 10/06/21 2014 75     Resp 10/06/21 2014 18     Temp 10/06/21 2014 98 F (36.7 C)     Temp Source 10/06/21 2014 Oral     SpO2 10/06/21 2014 97 %     Weight --      Height --      Head Circumference --      Peak Flow --      Pain Score 10/06/21 2012 10     Pain Loc --      Pain Edu? --      Excl. in GC? --    No data found.  Updated Vital Signs BP (!) 151/90 (BP Location: Right Arm)   Pulse 75  Temp 98 F (36.7 C) (Oral)   Resp 18   SpO2 97%   Visual Acuity Right Eye Distance:   Left Eye Distance:   Bilateral Distance:    Right Eye Near:   Left Eye Near:    Bilateral Near:     Physical Exam Constitutional:      Appearance: Normal appearance.  Eyes:     Extraocular Movements: Extraocular movements intact.  Pulmonary:     Effort: Pulmonary effort is normal.  Musculoskeletal:     Comments: Tenderness, ecchymosis and mild to moderate swelling is present to the lateral malleolus of the left ankle, 2+ dorsalis pedis pulse, able to bear weight and range of motion is intact, sensation is intact  Neurological:     Mental Status: He is alert and oriented to person, place, and time. Mental status is at baseline.  Psychiatric:        Mood and Affect: Mood normal.        Behavior: Behavior normal.      UC Treatments / Results  Labs (all labs ordered are listed, but only abnormal results are  displayed) Labs Reviewed - No data to display  EKG   Radiology No results found.  Procedures Procedures (including critical care time)  Medications Ordered in UC Medications - No data to display  Initial Impression / Assessment and Plan / UC Course  I have reviewed the triage vital signs and the nursing notes.  Pertinent labs & imaging results that were available during my care of the patient were reviewed by me and considered in my medical decision making (see chart for details).  Acute left ankle pain  X-ray negative, discussed with patient, recommended over-the-counter ibuprofen use consistently for management of pain as well as RICE for additional comfort, given walker referral to orthopedics if symptoms persist or worsen Final Clinical Impressions(s) / UC Diagnoses   Final diagnoses:  None   Discharge Instructions   None    ED Prescriptions   None    PDMP not reviewed this encounter.   Valinda Hoar, [place] 10/09/21 [phone]

## 2021-10-09 DIAGNOSIS — Z419 Encounter for procedure for purposes other than remedying health state, unspecified: Secondary | ICD-10-CM | POA: Diagnosis not present

## 2021-11-08 DIAGNOSIS — Z419 Encounter for procedure for purposes other than remedying health state, unspecified: Secondary | ICD-10-CM | POA: Diagnosis not present

## 2021-12-09 DIAGNOSIS — Z419 Encounter for procedure for purposes other than remedying health state, unspecified: Secondary | ICD-10-CM | POA: Diagnosis not present

## 2022-01-08 DIAGNOSIS — Z419 Encounter for procedure for purposes other than remedying health state, unspecified: Secondary | ICD-10-CM | POA: Diagnosis not present

## 2022-01-25 ENCOUNTER — Ambulatory Visit (INDEPENDENT_AMBULATORY_CARE_PROVIDER_SITE_OTHER): Payer: Medicaid Other | Admitting: Student

## 2022-01-25 ENCOUNTER — Encounter: Payer: Self-pay | Admitting: Student

## 2022-01-25 VITALS — BP 120/78 | HR 73 | Ht 69.0 in | Wt 197.2 lb

## 2022-01-25 DIAGNOSIS — Z23 Encounter for immunization: Secondary | ICD-10-CM | POA: Diagnosis not present

## 2022-01-25 DIAGNOSIS — Z0000 Encounter for general adult medical examination without abnormal findings: Secondary | ICD-10-CM

## 2022-01-25 DIAGNOSIS — Z7689 Persons encountering health services in other specified circumstances: Secondary | ICD-10-CM

## 2022-01-25 NOTE — Patient Instructions (Addendum)
Ha sido maravilloso conocerte hoy. Gracias por permitirme ser parte de su cuidado. A continuacin se muestra un breve resumen de lo que discutimos en su visita de hoy:  Felicito sus esfuerzos por venir a Cabin crew y algunos monitorean su salud de Loss adjuster, chartered.  Haga un seguimiento en 1-2 meses para su visita anual de sano.  En ese momento revisaremos su nivel de colesterol, su funcin renal, sus electrolitos y recuentos sanguneos.  Recibi la vacuna contra la gripe hoy.  Por favor, traiga todos sus medicamentos a cada cita!  Si tiene alguna pregunta o inquietud, no dude en comunicarse con nosotros por telfono o mensaje de MyChart.   Dr. Marily Lente de [facility]

## 2022-01-25 NOTE — Progress Notes (Signed)
New Patient Office Visit  Subjective    Patient ID: Shawn French, male    DOB: 06/02/1970  Age: 51 y.o. MRN: [id number]  CC:  Chief Complaint  Patient presents with   Establish Care   Encounter performed with video Spanish interpreter: Shawn French [id number]  HPI Shawn French presents to establish care He reports no medical concerns at this time.  He is here to establish care for close monitoring of his health overall.  PMH: None medication: None Surgical history: Tonsillectomy over 20 years ago Family history: Mom- hypertension Dad:-hypertension Maternal grandfather and grandmother had cancer (unsure on type of cancer, reports before his birth)    Diet: Regular, rice, chicken, vegetables Exercise: No exercise but active with work Tobacco: No tobacco use Alcohol use: Rarely use alcohol Drug use: Never Lives with wife and  5 sons  Work:  Corporate investment banker for [employer].  Outpatient Encounter Medications as of 01/25/2022  Medication Sig   [DISCONTINUED] acetaminophen (TYLENOL) 160 MG/5ML liquid Take by mouth every 4 (four) hours as needed for fever.   [DISCONTINUED] HYDROcodone-acetaminophen (NORCO/VICODIN) 5-325 MG tablet Take 1-2 tablets by mouth every 4 (four) hours as needed. (Patient not taking: Reported on 01/25/2022)   [DISCONTINUED] ibuprofen (ADVIL,MOTRIN) 600 MG tablet Take 1 tablet (600 mg total) by mouth every 6 (six) hours as needed. (Patient not taking: Reported on 01/25/2022)   [DISCONTINUED] naproxen (NAPROSYN) 500 MG tablet Take 1 tablet (500 mg total) by mouth 2 (two) times daily. (Patient not taking: Reported on 01/25/2022)   [DISCONTINUED] tiZANidine (ZANAFLEX) 4 MG tablet Take 1 tablet (4 mg total) by mouth every 6 (six) hours as needed for muscle spasms. (Patient not taking: Reported on 01/25/2022)   No facility-administered encounter medications on file as of 01/25/2022.    Past Medical History:  Diagnosis Date   GERD (gastroesophageal  reflux disease)    Hyperlipidemia     Past Surgical History:  Procedure Laterality Date   TONSILLECTOMY Bilateral    Over 20 years    Family History  Problem Relation Age of Onset   Hyperlipidemia Mother    Hypertension Mother    Hyperlipidemia Sister     Social History   Socioeconomic History   Marital status: Married    Spouse name: Not on file   Number of children: 5   Years of education: Not on file   Highest education level: Not on file  Occupational History   Occupation: Holiday representative l worker    Comment: Ecologist  Tobacco Use   Smoking status: Never   Smokeless tobacco: Never  Vaping Use   Vaping Use: Never used  Substance and Sexual Activity   Alcohol use: No   Drug use: Never   Sexual activity: Yes  Other Topics Concern   Not on file  Social History Narrative   Not on file   Social Determinants of Health   Financial Resource Strain: Low Risk  (01/25/2022)   Overall Financial Resource Strain (CARDIA)    Difficulty of Paying Living Expenses: Not very hard  Food Insecurity: No Food Insecurity (01/25/2022)   Hunger Vital Sign    Worried About Running Out of Food in the Last Year: Never true    Ran Out of Food in the Last Year: Never true  Transportation Needs: No Transportation Needs (01/25/2022)   PRAPARE - Administrator, Civil Service (Medical): No    Lack of Transportation (Non-Medical): No  Physical Activity: Sufficiently Active (01/25/2022)  Exercise Vital Sign    Days of Exercise per Week: 5 days    Minutes of Exercise per Session: 30 min  Stress: Not on file  Social Connections: Unknown (01/25/2022)   Social Connection and Isolation Panel [NHANES]    Frequency of Communication with Friends and Family: More than three times a week    Frequency of Social Gatherings with Friends and Family: More than three times a week    Attends Religious Services: Not on file    Active Member of Clubs or Organizations: Not on file     Attends Banker Meetings: Not on file    Marital Status: Not on file  Intimate Partner Violence: Not At Risk (01/25/2022)   Humiliation, Afraid, Rape, and Kick questionnaire    Fear of Current or Ex-Partner: No    Emotionally Abused: No    Physically Abused: No    Sexually Abused: No    Review of Systems  Constitutional: Negative.   HENT: Negative.    Respiratory: Negative.    Cardiovascular: Negative.   Gastrointestinal: Negative.   Genitourinary: Negative.        Objective    BP 120/78   Pulse 73   Ht 5\' 9"  (1.753 m)   Wt 197 lb 3.2 oz (89.4 kg)   SpO2 97%   BMI 29.12 kg/m   Physical Exam General: Alert, well appearing, NAD HEENT: Atraumatic, MMM, No sclera icterus CV: RRR, no murmurs, normal S1/S2 Pulm: CTAB, good WOB on RA, no crackles or wheezing Abd: Soft, no distension, no tenderness Skin: dry, warm Ext: No BLE edema, +2 Pedal and radial pulse.   Assessment & Plan:   Establishing care Patient is a 51 year old male, generally healthy presenting today to establish care. He has no medical or social concerns today. Will follow up in 1-2 months for annual well visit. At that visit will obtain lab including CBC, Hep C and HIV screen, Lipid panel, A1c  Follow up in 1-2 months  44, MD

## 2022-02-22 ENCOUNTER — Ambulatory Visit: Payer: Medicaid Other | Admitting: Student

## 2022-02-25 ENCOUNTER — Ambulatory Visit (INDEPENDENT_AMBULATORY_CARE_PROVIDER_SITE_OTHER): Payer: Medicaid Other | Admitting: Student

## 2022-02-25 ENCOUNTER — Encounter: Payer: Self-pay | Admitting: Student

## 2022-02-25 VITALS — BP 118/64 | HR 74 | Ht 70.0 in | Wt 196.0 lb

## 2022-02-25 DIAGNOSIS — H539 Unspecified visual disturbance: Secondary | ICD-10-CM

## 2022-02-25 DIAGNOSIS — Z0000 Encounter for general adult medical examination without abnormal findings: Secondary | ICD-10-CM

## 2022-02-25 DIAGNOSIS — R21 Rash and other nonspecific skin eruption: Secondary | ICD-10-CM

## 2022-02-25 DIAGNOSIS — Z1211 Encounter for screening for malignant neoplasm of colon: Secondary | ICD-10-CM

## 2022-02-25 LAB — POCT GLYCOSYLATED HEMOGLOBIN (HGB A1C): Hemoglobin A1C: 5.2 % (ref 4.0–5.6)

## 2022-02-25 MED ORDER — TRIAMCINOLONE ACETONIDE 0.1 % EX OINT: 1.0000 | TOPICAL_OINTMENT | Freq: Two times a day (BID) | CUTANEOUS | 0 refills | Status: AC

## 2022-02-25 NOTE — Progress Notes (Addendum)
Annual Wellness Visit     Patient: Shawn French, Male    DOB: 1970/11/11, 52 y.o.   MRN: [id number]  Subjective  Chief Complaint  Patient presents with   Annual Exam    Shawn French is a 52 y.o. male who presents today for his Annual Wellness Visit. He reports consuming a general diet. The patient does not participate in regular exercise at present. He generally feels well. He reports sleeping well. He does not have additional problems to discuss today.    Medications: No outpatient medications prior to visit.   No facility-administered medications prior to visit.    No Known Allergies  Patient Care Team: Alen Bleacher, MD as PCP - General (Family Medicine)  Objective  BP 118/64   Pulse 74   Ht 5\' 10"  (1.778 m)   Wt 196 lb (88.9 kg)   SpO2 99%   BMI 28.12 kg/m    Physical Exam General: Alert, well appearing, NAD HEENT: Atraumatic, MMM, No sclera icterus CV: RRR, no murmurs, normal S1/S2 Pulm: CTAB, good WOB on RA, no crackles or wheezing Abd: Soft, no distension, no tenderness Skin: dry, warm Ext: No BLE edema, +2 Pedal and radial pulse.     Most recent depression screenings:    02/25/2022    2:37 PM 01/25/2022   10:25 AM  PHQ 2/9 Scores  PHQ - 2 Score 0 1  PHQ- 9 Score 8 9    Most recent Audit-C alcohol use screening    01/25/2022   12:11 PM  Alcohol Use Disorder Test (AUDIT)  1. How often do you have a drink containing alcohol? 1  2. How many drinks containing alcohol do you have on a typical day when you are drinking? 0  3. How often do you have six or more drinks on one occasion? 0  AUDIT-C Score 1   A score of 3 or more in women, and 4 or more in men indicates increased risk for alcohol abuse, EXCEPT if all of the points are from question 1    Assessment & Plan   Overall healthy 52 year old male with no medical concerns today.  Does endorse night vision issues and having to use reading glasses. -Obtain labs for CBC, A1c, lipid panel  and BMP. -Placed GI referral for colonoscopy -Placed ophthalmology referral for eye exam  Skin Rash Patient reports of itchy rash and on exam noted scaly dry rash on the right arm consistent with eczema. -Rx triamcinolone 1% cream which patient will apply twice daily on the affected areas. -Courage keeping skin moisturized with Vaseline or Aquaphor  Annual wellness visit done today including the all of the following: Reviewed patient's Family Medical History Reviewed and updated list of patient's medical providers Assessment of cognitive impairment was done Assessed patient's functional ability Established a written schedule for health screening [place] Completed and Reviewed  Exercise Activities and Dietary recommendations  Goals   None     Immunization History  Administered Date(s) Administered   Influenza,inj,Quad PF,6+ Mos 01/25/2022   PFIZER(Purple Top)SARS-COV-2 Vaccination 05/17/2019, 06/11/2019    Health Maintenance  Topic Date Due   HIV Screening  Never done   Hepatitis C Screening  Never done   DTaP/Tdap/Td (1 - Tdap) Never done   COLONOSCOPY (Pts 45-80yrs Insurance coverage will need to be confirmed)  Never done   Zoster Vaccines- Shingrix (1 of 2) Never done   COVID-19 Vaccine (3 - 2023-24 season) 10/09/2021   INFLUENZA VACCINE  Completed  HPV VACCINES  Aged Out   Discussed health benefits of physical activity, and encouraged him to engage in regular exercise appropriate for his age and condition.     Alen Bleacher, MD

## 2022-02-25 NOTE — Patient Instructions (Addendum)
Fue maravilloso verte hoy. Gracias por permitirme ser parte de su cuidado. A continuacin se muestra un breve resumen de lo que discutimos en su visita de hoy:   En general lo ests haciendo bien.  Hoy obteniendo laboratorios para Chief Technology Officer sus recuentos sanguneos, su colesterol, controlar la diabetes, la hepatitis C y [place].  He enviado una derivacin para ver a Radiation protection practitioner para que le haga una colonoscopia.  Tambin le he recomendado que consulte a un oftalmlogo para su visin. Debera esperar una llamada del especialista para programar una cita con l.  Sheela Stack todos sus medicamentos a cada cita!  Si tiene alguna pregunta o inquietud, no dude en comunicarse con nosotros por telfono o mensaje de MyChart.  Dr. Angie Fava de [facility]    It was wonderful to see you today. Thank you for allowing me to be a part of your care. Below is a short summary of what we discussed at your visit today:   Overall you are doing well.  Today obtaining labs to check your blood counts, your cholesterol, check for diabetes, hepatitis C and HIV.  I have put in a referral to see a gastroenterologist to do your colonoscopy.  I have also put in referral to see an ophthalmologist for your vision.  You should be expecting a call from the specialist to schedule an appointment with them.   Please bring all of your medications to every appointment!  If you have any questions or concerns, please do not hesitate to contact us via phone or MyChart message.   Alen Bleacher, MD [facility]

## 2022-02-26 LAB — CBC WITH DIFFERENTIAL/PLATELET
Basophils Absolute: 0.1 10*3/uL (ref 0.0–0.2)
Basos: 1 %
EOS (ABSOLUTE): 0.1 10*3/uL (ref 0.0–0.4)
Eos: 2 %
Hematocrit: 44.4 % (ref 37.5–51.0)
Hemoglobin: 15.5 g/dL (ref 13.0–17.7)
Immature Grans (Abs): 0 10*3/uL (ref 0.0–0.1)
Immature Granulocytes: 0 %
Lymphocytes Absolute: 1.4 10*3/uL (ref 0.7–3.1)
Lymphs: 23 %
MCH: 31.5 pg (ref 26.6–33.0)
MCHC: 34.9 g/dL (ref 31.5–35.7)
MCV: 90 fL (ref 79–97)
Monocytes Absolute: 0.4 10*3/uL (ref 0.1–0.9)
Monocytes: 6 %
Neutrophils Absolute: 4.2 10*3/uL (ref 1.4–7.0)
Neutrophils: 68 %
Platelets: 355 10*3/uL (ref 150–450)
RBC: 4.92 x10E6/uL (ref 4.14–5.80)
RDW: 12.5 % (ref 11.6–15.4)
WBC: 6.2 10*3/uL (ref 3.4–10.8)

## 2022-02-26 LAB — HIV ANTIBODY (ROUTINE TESTING W REFLEX): HIV Screen 4th Generation wRfx: NONREACTIVE

## 2022-02-26 LAB — LIPID PANEL
Chol/HDL Ratio: 4 ratio (ref 0.0–5.0)
Cholesterol, Total: 242 mg/dL — ABNORMAL HIGH (ref 100–199)
HDL: 60 mg/dL (ref >39)
LDL Chol Calc (NIH): 146 mg/dL — ABNORMAL HIGH (ref 0–99)
Triglycerides: 203 mg/dL — ABNORMAL HIGH (ref 0–149)
VLDL Cholesterol Cal: 36 mg/dL (ref 5–40)

## 2022-02-26 LAB — BASIC METABOLIC PANEL
BUN/Creatinine Ratio: 15 (ref 9–20)
BUN: 11 mg/dL (ref 6–24)
CO2: 22 mmol/L (ref 20–29)
Calcium: 9.2 mg/dL (ref 8.7–10.2)
Chloride: 103 mmol/L (ref 96–106)
Creatinine, Ser: 0.72 mg/dL — ABNORMAL LOW (ref 0.76–1.27)
Glucose: 86 mg/dL (ref 70–99)
Potassium: 4.3 mmol/L (ref 3.5–5.2)
Sodium: 141 mmol/L (ref 134–144)
eGFR: 111 mL/min/{1.73_m2} (ref >59)

## 2022-02-26 LAB — HCV INTERPRETATION

## 2022-02-26 LAB — HCV AB W REFLEX TO QUANT PCR: HCV Ab: NONREACTIVE

## 2022-02-26 NOTE — Addendum Note (Signed)
Addended by: Benita Gutter on: 02/26/2022 01:38 PM   Modules accepted: Orders

## 2022-03-04 ENCOUNTER — Telehealth: Payer: Self-pay | Admitting: Student

## 2022-03-04 NOTE — Telephone Encounter (Signed)
Called patient to discuss recent lab results with lipid panel.  Unable to reach patient and left a generic voicemail.

## 2022-03-11 DIAGNOSIS — Z419 Encounter for procedure for purposes other than remedying health state, unspecified: Secondary | ICD-10-CM | POA: Diagnosis not present

## 2022-04-02 IMAGING — DX DG RIBS W/ CHEST 3+V*L*
3 series · 3 of 3 positions shown · non-contrast
Comparison: A 09/27/2012

CLINICAL DATA: Fell from a ladder 3 days ago. Left-sided posterior
rib pain.

EXAM:
LEFT RIBS AND CHEST - 3+ VIEW

[chest pa]
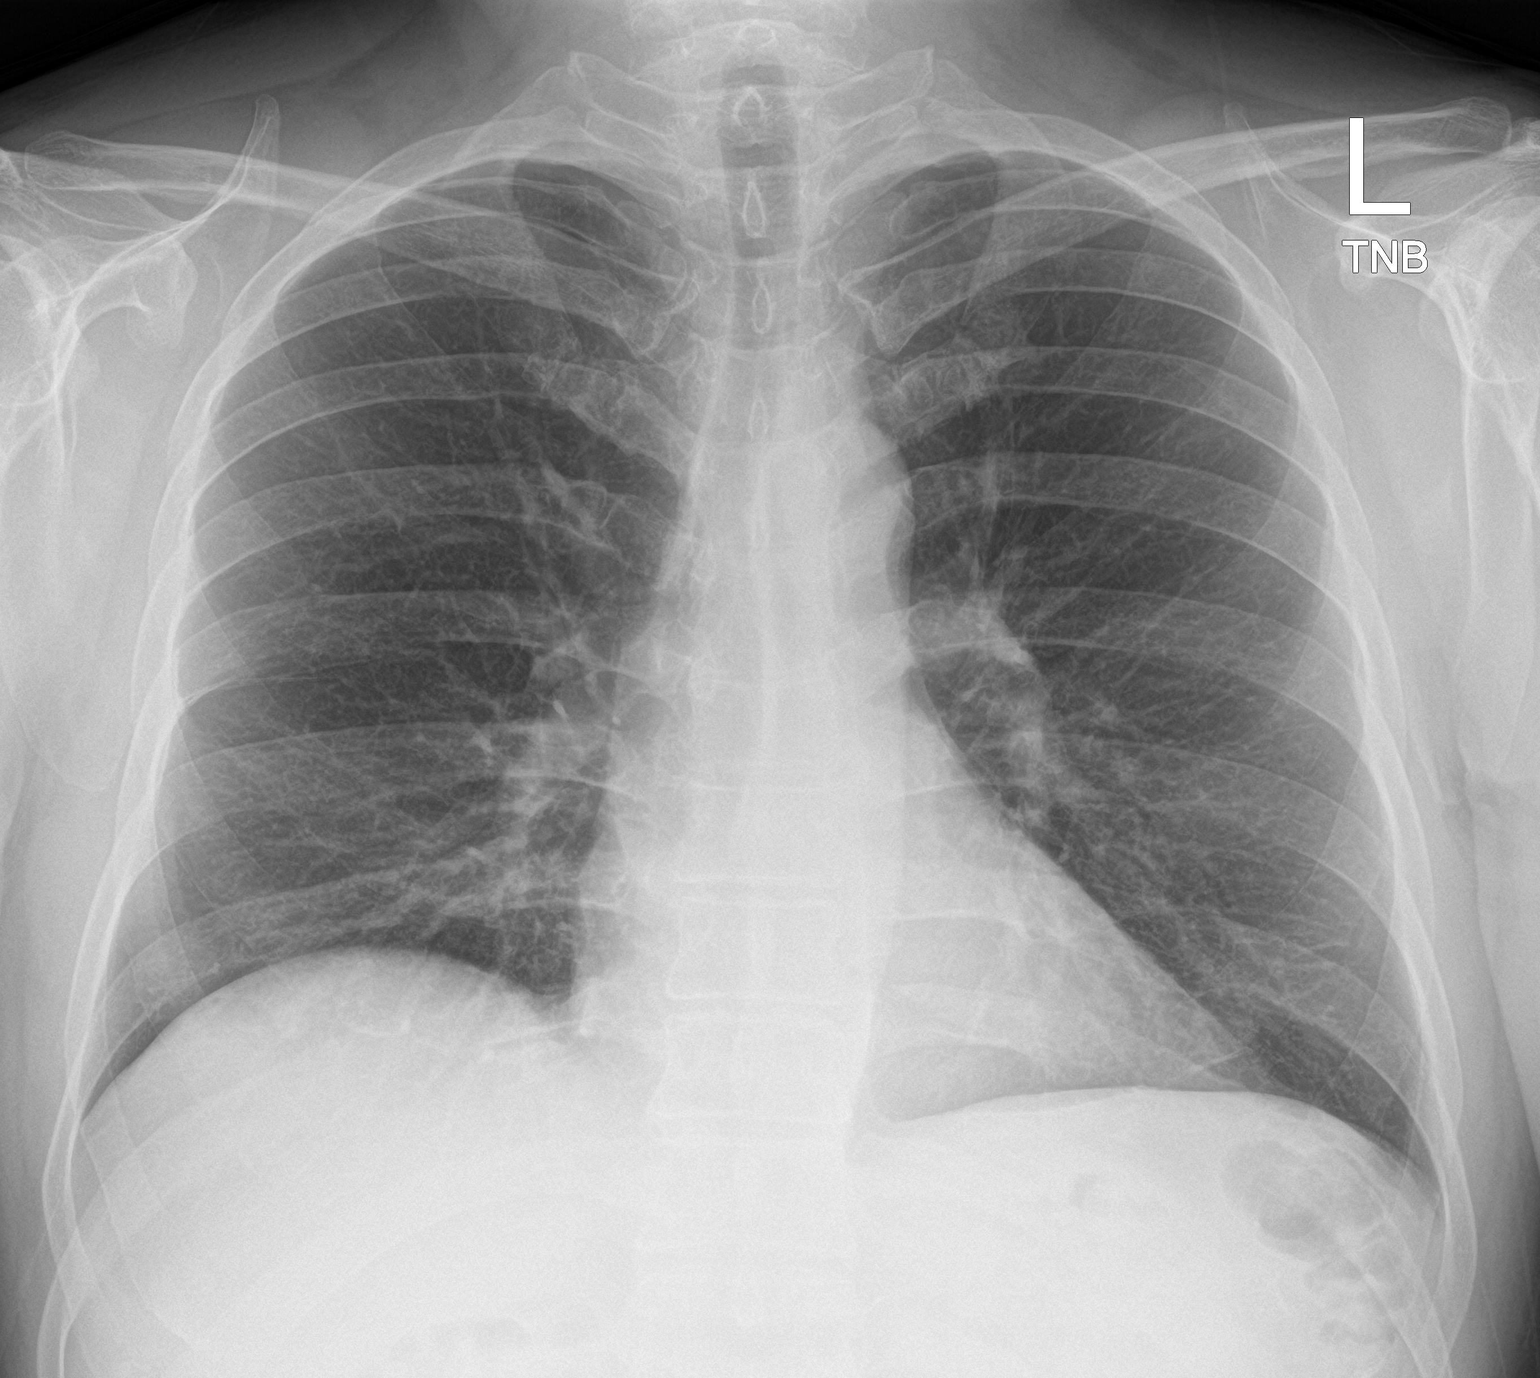

[rib obl]
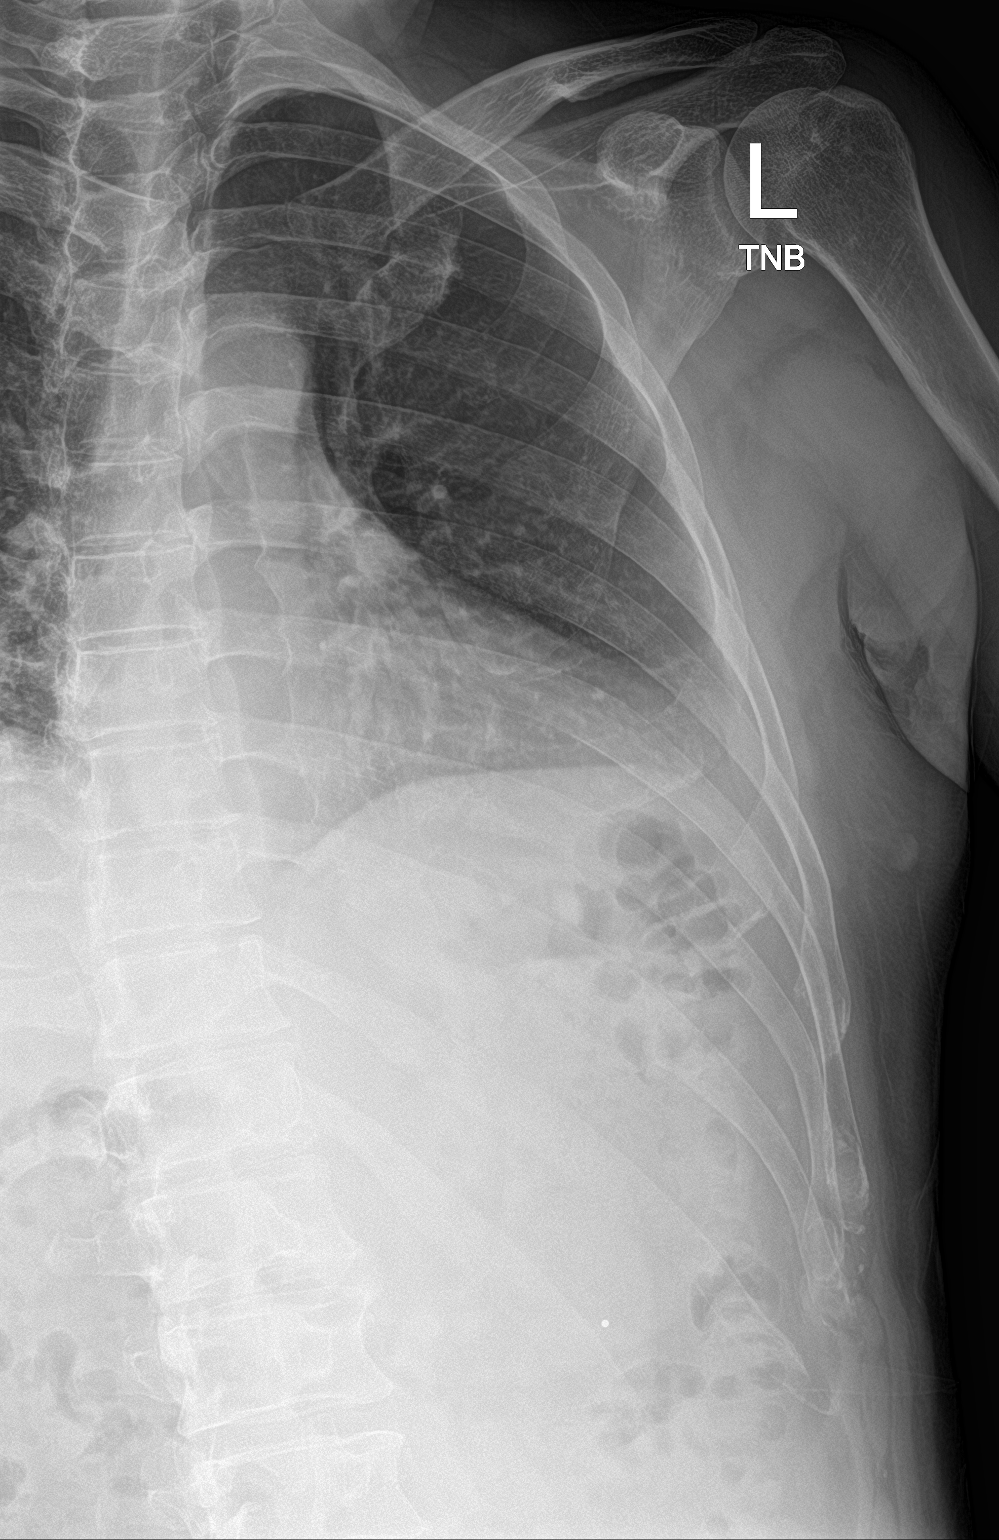

[rib pa]
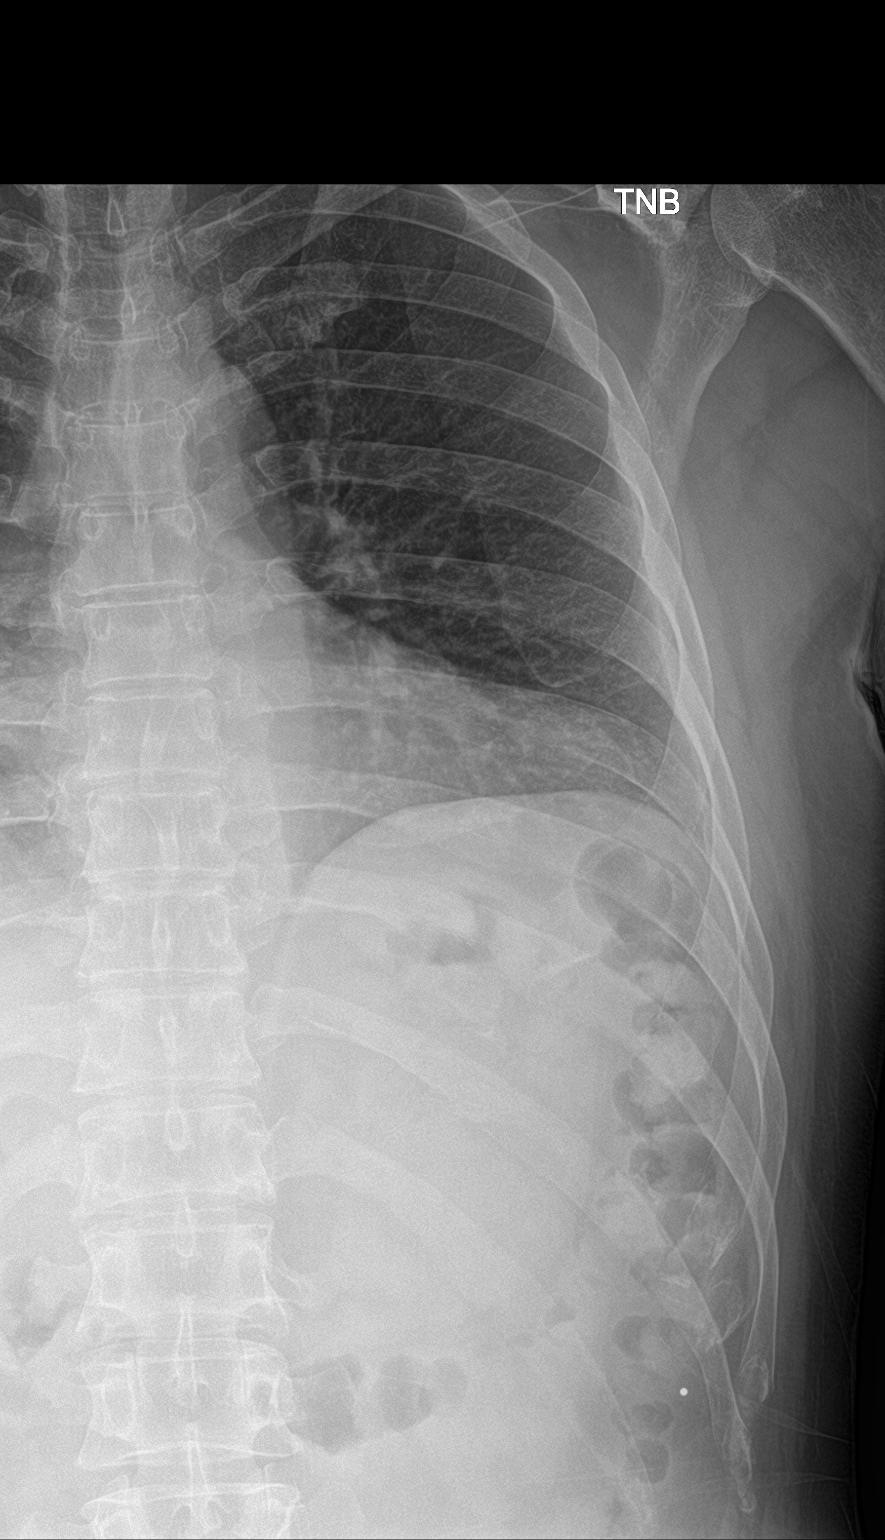

[3 of 3 positions shown; findings below may reference images not displayed]

FINDINGS: The cardiac silhouette, mediastinal and hilar contours are within
normal limits.

Minimal streaky basilar atelectasis but no infiltrates, effusions or
pneumothorax.

Dedicated left rib films do not demonstrate any definite acute
left-sided rib fractures. No pleural thickening.
IMPRESSION: Minimal streaky basilar atelectasis. No definite acute left-sided
rib fractures.

## 2022-04-09 DIAGNOSIS — Z419 Encounter for procedure for purposes other than remedying health state, unspecified: Secondary | ICD-10-CM | POA: Diagnosis not present

## 2022-05-10 DIAGNOSIS — Z419 Encounter for procedure for purposes other than remedying health state, unspecified: Secondary | ICD-10-CM | POA: Diagnosis not present

## 2022-05-14 ENCOUNTER — Encounter: Payer: Self-pay | Admitting: Gastroenterology

## 2022-05-27 ENCOUNTER — Ambulatory Visit: Payer: Medicaid Other

## 2022-05-27 NOTE — Progress Notes (Unsigned)
Pt's pre-visit is done in person   Pt's name and DOB verified at the beginning of the pre-visit.  Pt has no difficulty with ambulating.  No egg or soy allergy known to patient  No issues known to pt with past sedation with any surgeries or procedures Pt denies having issues being intubated Patient denies ever being intubated Pt has no issues moving head neck or swallowing No FH of Malignant Hyperthermia Pt is not on diet pills Pt is not on home 02  Pt is not on blood thinners  Pt denies issues with constipation  Pt has frequent issues with constipation RN instructed pt to use Miralax per bottles instructions a week before prep days. Pt states they will Pt is not on dialysis Pt denies any upcoming cardiac testing Pt encouraged to use to use Singlecare or Goodrx to reduce cost  Patient's chart reviewed by Cathlyn Parsons CNRA prior to pre-visit and patient appropriate for the [facility].  Pre-visit completed and red dot placed by patient's name on their procedure day (on provider's schedule).  . Visit in person Pt scale weight is  Instructions given to pt

## 2022-06-08 ENCOUNTER — Ambulatory Visit (AMBULATORY_SURGERY_CENTER): Payer: Medicaid Other

## 2022-06-08 VITALS — Ht 70.0 in | Wt 196.2 lb

## 2022-06-08 DIAGNOSIS — Z1211 Encounter for screening for malignant neoplasm of colon: Secondary | ICD-10-CM

## 2022-06-08 MED ORDER — PEG 3350-KCL-NA BICARB-NACL 420 G PO SOLR: 4000.0000 mL | Freq: Once | ORAL | 0 refills | Status: AC

## 2022-06-08 NOTE — Progress Notes (Signed)
No egg or soy allergy known to patient   No issues known to pt with past sedation with any surgeries or procedures  Patient denies ever being told they had issues or difficulty with intubation   No FH of Malignant Hyperthermia  Pt is not on diet pills  Pt is not on  home 02   Pt is not on blood thinners   Pt denies issues with constipation   No A fib or A flutter  Have any cardiac testing pending--No  Pt instructed to use Singlecare.com or GoodRx for a price reduction on prep    In person previsit with wife and interpreter present   Patient's chart reviewed by Cathlyn Parsons CNRA prior to previsit and patient appropriate for the [facility].  Previsit completed and red dot placed by patient's name on their procedure day (on provider's schedule).

## 2022-06-09 DIAGNOSIS — Z419 Encounter for procedure for purposes other than remedying health state, unspecified: Secondary | ICD-10-CM | POA: Diagnosis not present

## 2022-06-17 ENCOUNTER — Ambulatory Visit (AMBULATORY_SURGERY_CENTER): Payer: Medicaid Other | Admitting: Gastroenterology

## 2022-06-17 ENCOUNTER — Encounter: Payer: Self-pay | Admitting: Gastroenterology

## 2022-06-17 VITALS — BP 139/88 | HR 61 | Temp 98.0°F | Resp 18 | Ht 70.0 in | Wt 196.2 lb

## 2022-06-17 DIAGNOSIS — K640 First degree hemorrhoids: Secondary | ICD-10-CM | POA: Diagnosis not present

## 2022-06-17 DIAGNOSIS — I10 Essential (primary) hypertension: Secondary | ICD-10-CM | POA: Diagnosis not present

## 2022-06-17 DIAGNOSIS — Z1211 Encounter for screening for malignant neoplasm of colon: Secondary | ICD-10-CM | POA: Diagnosis not present

## 2022-06-17 DIAGNOSIS — E785 Hyperlipidemia, unspecified: Secondary | ICD-10-CM | POA: Diagnosis not present

## 2022-06-17 DIAGNOSIS — D122 Benign neoplasm of ascending colon: Secondary | ICD-10-CM

## 2022-06-17 MED ORDER — SODIUM CHLORIDE 0.9 % IV SOLN: 500.0000 mL | INTRAVENOUS | Status: DC

## 2022-06-17 NOTE — Op Note (Signed)
[facility] Patient Name: Shawn French Procedure Date: 06/17/2022 11:10 AM MRN: [id number] Endoscopist: Doristine Locks , MD, [id number] Age: 52 Referring MD:  Date of Birth: 10-Aug-1970 Gender: Male Account #: [id number] Procedure:                Colonoscopy Indications:              Screening for colorectal malignant neoplasm, This                            is the patient's first colonoscopy Medicines:                Monitored Anesthesia Care Procedure:                Pre-Anesthesia Assessment:                           - Prior to the procedure, a History and Physical                            was performed, and patient medications and                            allergies were reviewed. The patient's tolerance of                            previous anesthesia was also reviewed. The risks                            and benefits of the procedure and the sedation                            options and risks were discussed with the patient.                            All questions were answered, and informed consent                            was obtained. Prior Anticoagulants: The patient has                            taken no anticoagulant or antiplatelet agents. ASA                            Grade Assessment: II - A patient with mild systemic                            disease. After reviewing the risks and benefits,                            the patient was deemed in satisfactory condition to                            undergo the procedure.  After obtaining informed consent, the colonoscope                            was passed under direct vision. Throughout the                            procedure, the patient's blood pressure, pulse, and                            oxygen saturations were monitored continuously. The                            CF [id number] #[id number] was introduced through the anus                            and advanced to the  the cecum, identified by                            appendiceal orifice and ileocecal valve. The                            colonoscopy was performed without difficulty. The                            patient tolerated the procedure well. The quality                            of the bowel preparation was good. The ileocecal                            valve, appendiceal orifice, and rectum were                            photographed. Scope In: 11:18:21 AM Scope Out: 11:36:58 AM Scope Withdrawal Time: 0 hours 12 minutes 37 seconds  Total Procedure Duration: 0 hours 18 minutes 37 seconds  Findings:                 The perianal and digital rectal examinations were                            normal.                           Two sessile polyps were found in the ascending                            colon. The polyps were 3 to 5 mm in size. These                            polyps were removed with a cold snare. Resection                            and retrieval were complete. Estimated blood loss  was minimal.                           The exam was otherwise normal throughout the                            remainder of the colon.                           Non-bleeding internal hemorrhoids were found during                            retroflexion. The hemorrhoids were small. Complications:            No immediate complications. Estimated Blood Loss:     Estimated blood loss was minimal. Impression:               - Two 3 to 5 mm polyps in the ascending colon,                            removed with a cold snare. Resected and retrieved.                           - The remainder of the colon was normal appearing.                           - Non-bleeding internal hemorrhoids. Recommendation:           - Patient has a contact number available for                            emergencies. The signs and symptoms of potential                            delayed complications  were discussed with the                            patient. Return to normal activities tomorrow.                            Written discharge instructions were provided to the                            patient.                           - Resume previous diet.                           - Continue present medications.                           - Await pathology results.                           - Repeat colonoscopy for surveillance based on  pathology results.                           - Return to GI clinic PRN. Doristine Locks, MD 06/17/2022 11:40:54 AM

## 2022-06-17 NOTE — Progress Notes (Signed)
GASTROENTEROLOGY PROCEDURE H&P NOTE   Primary Care Physician: Jerre Simon, MD    Reason for Procedure:  Colon Cancer screening  Plan:    Colonoscopy  Patient is appropriate for endoscopic procedure(s) in the ambulatory ([facility]) setting.  The nature of the procedure, as well as the risks, benefits, and alternatives were carefully and thoroughly reviewed with the patient. Ample time for discussion and questions allowed. The patient understood, was satisfied, and agreed to proceed.     HPI: Shawn French is a 52 y.o. male who presents for colonoscopy for routine Colon Cancer screening.  No active GI symptoms.  No known family history of colon cancer or related malignancy.  Patient is otherwise without complaints or active issues today.  Past Medical History:  Diagnosis Date   Cataract    bilateral, removed   GERD (gastroesophageal reflux disease)    Hyperlipidemia     Past Surgical History:  Procedure Laterality Date   CATARACT EXTRACTION Left 2010   CATARACT EXTRACTION Right 2020   TONSILLECTOMY Bilateral    Over 20 years    Prior to Admission medications   Medication Sig Start Date End Date Taking? Authorizing Provider  lisinopril (ZESTRIL) 20 MG tablet Take by mouth.    [provider]  triamcinolone ointment (KENALOG) 0.1 % Apply 1 Application topically 2 (two) times daily. 02/25/22   Jerre Simon, MD    Current Outpatient Medications  Medication Sig Dispense Refill   lisinopril (ZESTRIL) 20 MG tablet Take by mouth.     triamcinolone ointment (KENALOG) 0.1 % Apply 1 Application topically 2 (two) times daily. 15 g 0   Current Facility-Administered Medications  Medication Dose Route Frequency Provider Last Rate Last Admin   0.9 %  sodium chloride infusion  500 mL Intravenous Continuous Bari Leib V, DO        Allergies as of 06/17/2022   (No Known Allergies)    Family History  Problem Relation Age of Onset   Hyperlipidemia Mother     Hypertension Mother    Hyperlipidemia Sister    Colon cancer Neg Hx    Colon polyps Neg Hx    Esophageal cancer Neg Hx    Rectal cancer Neg Hx    Stomach cancer Neg Hx     Social History   Socioeconomic History   Marital status: Married    Spouse name: Not on file   Number of children: 5   Years of education: Not on file   Highest education level: Not on file  Occupational History   Occupation: Tax adviser    Comment: Ecologist  Tobacco Use   Smoking status: Never    Passive exposure: Never   Smokeless tobacco: Never  Vaping Use   Vaping Use: Never used  Substance and Sexual Activity   Alcohol use: Yes    Alcohol/week: 14.0 standard drinks of alcohol    Types: 14 Cans of beer per week   Drug use: Never   Sexual activity: Yes  Other Topics Concern   Not on file  Social History Narrative   Not on file   Social Determinants of Health   Financial Resource Strain: Low Risk  (01/25/2022)   Overall Financial Resource Strain (CARDIA)    Difficulty of Paying Living Expenses: Not very hard  Food Insecurity: No Food Insecurity (01/25/2022)   Hunger Vital Sign    Worried About Running Out of Food in the Last Year: Never true    Ran Out of Food  in the Last Year: Never true  Transportation Needs: No Transportation Needs (01/25/2022)   PRAPARE - Administrator, Civil Service (Medical): No    Lack of Transportation (Non-Medical): No  Physical Activity: Sufficiently Active (01/25/2022)   Exercise Vital Sign    Days of Exercise per Week: 5 days    Minutes of Exercise per Session: 30 min  Stress: Not on file  Social Connections: Unknown (01/25/2022)   Social Connection and Isolation Panel [NHANES]    Frequency of Communication with Friends and Family: More than three times a week    Frequency of Social Gatherings with Friends and Family: More than three times a week    Attends Religious Services: Not on file    Active Member of Clubs or  Organizations: Not on file    Attends Banker Meetings: Not on file    Marital Status: Not on file  Intimate Partner Violence: Not At Risk (01/25/2022)   Humiliation, Afraid, Rape, and Kick questionnaire    Fear of Current or Ex-Partner: No    Emotionally Abused: No    Physically Abused: No    Sexually Abused: No    Physical Exam: Vital signs in last 24 hours: @BP  132/77   Temp 98 F (36.7 C) (Skin)   Ht 5\' 10"  (1.778 m)   Wt 196 lb 3.2 oz (89 kg)   SpO2 98%   BMI 28.15 kg/m  GEN: NAD EYE: Sclerae anicteric ENT: MMM CV: Non-tachycardic Pulm: CTA b/l GI: Soft, NT/ND NEURO:  Alert & Oriented x 3   Doristine Locks, DO [facility]   06/17/2022 11:05 AM

## 2022-06-17 NOTE — Progress Notes (Signed)
Pt resting comfortably. VSS. Airway intact. SBAR complete to RN. All questions answered.   

## 2022-06-17 NOTE — Progress Notes (Signed)
Called to room to assist during endoscopic procedure.  Patient ID and intended procedure confirmed with present staff. Received instructions for my participation in the procedure from the performing physician.  

## 2022-06-17 NOTE — Patient Instructions (Signed)
-Handout on polyps and Hemorrhoids provided. -Se proporciona folleto sobre plipos y hemorroides. -await pathology results.-esperar resultados de patologa -repeat colonoscopy for surveillance recommended. Date to be determined when pathology result become available.-Se recomienda repetir la colonoscopia para vigilancia. Fecha por determinar cuando el resultado de la patologa est disponible. -Continue present medications. Continuar con Tourist information centre manager actual.  USTED TUVO UN PROCEDIMIENTO ENDOSCPICO HOY EN EL [facility]:   Lea el informe del procedimiento que se le entreg para cualquier pregunta especfica sobre lo que se Dentist.  Si el informe del examen no responde a sus preguntas, por favor llame a su gastroenterlogo para aclararlo.  Si usted solicit que no se le den [employer] de lo que se Clinical cytogeneticist en su procedimiento al [employer] va a cuidar, entonces el informe del procedimiento se ha incluido en un sobre sellado para que usted lo revise despus cuando le sea ms conveniente.   LO QUE PUEDE ESPERAR: Algunas sensaciones de hinchazn en el abdomen.  Puede tener ms gases de lo normal.  El caminar puede ayudarle a eliminar el aire que se le puso en el tracto gastrointestinal durante el procedimiento y reducir la hinchazn.  Si le hicieron una endoscopia inferior (como una colonoscopia o una sigmoidoscopia flexible), podra notar manchas de sangre en las heces fecales o en el papel higinico.  Si se someti a una preparacin intestinal para su procedimiento, es posible que no tenga una evacuacin intestinal normal durante [employer].   Tenga en cuenta:  Es posible que note un poco de irritacin y congestin en la nariz o algn drenaje.  Esto es debido al oxgeno [employer] durante su procedimiento.  No hay que preocuparse y esto debe desaparecer ms o Regulatory affairs officer.   SNTOMAS PARA REPORTAR INMEDIATAMENTE:  Despus de una endoscopia inferior (colonoscopia  o sigmoidoscopia flexible):  Cantidades excesivas de sangre en las heces fecales  Sensibilidad significativa o empeoramiento de los dolores abdominales   Hinchazn aguda del abdomen que antes no tena   Fiebre de 100F o ms    Para asuntos urgentes o de Associate Professor, puede comunicarse con un gastroenterlogo a cualquier hora llamando al [phone].  DIETA:  Recomendamos una comida pequea al principio, pero luego puede continuar con su dieta normal.  Tome muchos lquidos, Tax adviser las bebidas alcohlicas durante 24 horas.    ACTIVIDAD:  Debe planear tomarse las cosas con calma por el resto del da y no debe CONDUCIR ni usar maquinaria pesada Patent examiner (debido a los medicamentos de sedacin utilizados durante el examen).     SEGUIMIENTO: Nuestro personal llamar al nmero que aparece en su historial al siguiente da hbil de su procedimiento para ver cmo se siente y para responder cualquier pregunta o inquietud que pueda tener con respecto a la informacin que se le dio despus del procedimiento. Si no podemos contactarle, le dejaremos un mensaje.  Sin embargo, si se siente bien y no tiene English as a second language teacher, no es necesario que nos devuelva la llamada.  Asumiremos que ha regresado a sus actividades diarias normales sin incidentes. Si se le tomaron algunas biopsias, le contactaremos por telfono o por carta en las prximas 3 semanas.  Si no ha sabido [employer] biopsias en el transcurso de 3 semanas, por favor llmenos al [phone].   FIRMAS/CONFIDENCIALIDAD: Usted y/o el acompaante que le cuide han firmado documentos que se ingresarn en su historial mdico Forensic scientist.  Estas firmas atestiguan el hecho de que  la informacin anterior

## 2022-06-17 NOTE — Progress Notes (Signed)
Pt. states no medical or surgical changes since previsit or office visit. 

## 2022-06-18 ENCOUNTER — Telehealth: Payer: Self-pay | Admitting: *Deleted

## 2022-06-18 NOTE — Telephone Encounter (Signed)
No answer on  follow up call. Left message.   

## 2022-06-28 ENCOUNTER — Encounter: Payer: Self-pay | Admitting: Gastroenterology

## 2022-07-10 DIAGNOSIS — Z419 Encounter for procedure for purposes other than remedying health state, unspecified: Secondary | ICD-10-CM | POA: Diagnosis not present

## 2022-08-09 DIAGNOSIS — Z419 Encounter for procedure for purposes other than remedying health state, unspecified: Secondary | ICD-10-CM | POA: Diagnosis not present

## 2022-09-09 DIAGNOSIS — Z419 Encounter for procedure for purposes other than remedying health state, unspecified: Secondary | ICD-10-CM | POA: Diagnosis not present

## 2022-10-10 DIAGNOSIS — Z419 Encounter for procedure for purposes other than remedying health state, unspecified: Secondary | ICD-10-CM | POA: Diagnosis not present

## 2022-12-10 DIAGNOSIS — Z419 Encounter for procedure for purposes other than remedying health state, unspecified: Secondary | ICD-10-CM | POA: Diagnosis not present

## 2023-01-09 DIAGNOSIS — Z419 Encounter for procedure for purposes other than remedying health state, unspecified: Secondary | ICD-10-CM | POA: Diagnosis not present

## 2023-02-09 DIAGNOSIS — Z419 Encounter for procedure for purposes other than remedying health state, unspecified: Secondary | ICD-10-CM | POA: Diagnosis not present

## 2023-03-12 DIAGNOSIS — Z419 Encounter for procedure for purposes other than remedying health state, unspecified: Secondary | ICD-10-CM | POA: Diagnosis not present

## 2023-03-25 DIAGNOSIS — H40013 Open angle with borderline findings, low risk, bilateral: Secondary | ICD-10-CM | POA: Diagnosis not present

## 2023-03-25 DIAGNOSIS — Z9889 Other specified postprocedural states: Secondary | ICD-10-CM | POA: Diagnosis not present

## 2023-04-09 DIAGNOSIS — Z419 Encounter for procedure for purposes other than remedying health state, unspecified: Secondary | ICD-10-CM | POA: Diagnosis not present

## 2023-05-21 DIAGNOSIS — Z419 Encounter for procedure for purposes other than remedying health state, unspecified: Secondary | ICD-10-CM | POA: Diagnosis not present

## 2023-06-20 DIAGNOSIS — Z419 Encounter for procedure for purposes other than remedying health state, unspecified: Secondary | ICD-10-CM | POA: Diagnosis not present

## 2023-07-21 DIAGNOSIS — Z419 Encounter for procedure for purposes other than remedying health state, unspecified: Secondary | ICD-10-CM | POA: Diagnosis not present

## 2023-08-20 DIAGNOSIS — Z419 Encounter for procedure for purposes other than remedying health state, unspecified: Secondary | ICD-10-CM | POA: Diagnosis not present

## 2023-09-20 DIAGNOSIS — Z419 Encounter for procedure for purposes other than remedying health state, unspecified: Secondary | ICD-10-CM | POA: Diagnosis not present

## 2023-10-21 DIAGNOSIS — Z419 Encounter for procedure for purposes other than remedying health state, unspecified: Secondary | ICD-10-CM | POA: Diagnosis not present

## 2023-11-09 ENCOUNTER — Ambulatory Visit (INDEPENDENT_AMBULATORY_CARE_PROVIDER_SITE_OTHER): Admitting: Student

## 2023-11-09 ENCOUNTER — Encounter: Payer: Self-pay | Admitting: Student

## 2023-11-09 ENCOUNTER — Other Ambulatory Visit: Payer: Self-pay | Admitting: Student

## 2023-11-09 VITALS — BP 135/91 | HR 75 | Ht 70.0 in | Wt 206.0 lb

## 2023-11-09 DIAGNOSIS — E782 Mixed hyperlipidemia: Secondary | ICD-10-CM

## 2023-11-09 DIAGNOSIS — Z23 Encounter for immunization: Secondary | ICD-10-CM

## 2023-11-09 DIAGNOSIS — F1090 Alcohol use, unspecified, uncomplicated: Secondary | ICD-10-CM | POA: Insufficient documentation

## 2023-11-09 DIAGNOSIS — Z0001 Encounter for general adult medical examination with abnormal findings: Secondary | ICD-10-CM

## 2023-11-09 DIAGNOSIS — R202 Paresthesia of skin: Secondary | ICD-10-CM | POA: Diagnosis not present

## 2023-11-09 NOTE — Assessment & Plan Note (Signed)
 Symptoms consistent with neuropathy.  Suspect his lower extremity paresthesia could be due to his significant alcohol use.  However at this time we will order labs to explore other etiologies.   - Ordered labs for vitamin B6, B12, CMP, HIV, RPR and TSH - Will consider starting him on Cymbalta, amitriptyline or gabapentin pending result findings - Discussed need for alcohol cessation.-Follow-up in 1 month

## 2023-11-09 NOTE — Patient Instructions (Addendum)
 Un placer verte hoy.  Tu presin arterial hoy estuvo ligeramente elevada. Por favor, contrlala diariamente y regresa en 2 semanas.  Tu nivel de colesterol estuvo elevado el ao pasado. Te recomiendo reducir el consumo de alcohol, as como las frituras y las dietas ricas en arroz blanco, pan blanco o pasta. El ejercicio tambin te ayudar a Nurse, mental health.  El ejercicio recomendado es de 150 minutos semanales de ejercicio de [place].  El entumecimiento en tus pies podra deberse a tu consumo crnico de alcohol. He solicitado anlisis de laboratorio que [employer] de vitaminas, Customer service manager, VIH, sfilis y [place] de tiroides.  Te recomiendo que reduzcas gradualmente el consumo de alcohol, ya que esto podra estar contribuyendo al entumecimiento que tienes en las piernas.  Si los sntomas persisten, por favor, regresa en 1 mes. Te recomiendo que tomes un medicamento para ayudar a Marketing executive en tus pies.  Hoy tambin te pusieron la vacuna contra la influenza, el ttanos y Transport planner.   Pleasure to see you today.  Your blood pressure today was slightly elevated.  Please check your blood pressure daily and return in 2 weeks   Your cholesterol level was elevated last year.  Recommend cutting down on alcohol, reducing and fried food and high Diet such as white rice, white bread or pasta's.  Exercise will also help in reducing your cholesterol.  Recommended exercise is 150 minutes of moderate intensity exercise weekly.  For the numbness in your feet I suspect this could be from your chronic alcohol use.  I have ordered some lab including vitamin levels, electrolytes, HIV, syphilis, thyroid levels.  Recommend slowly cutting down on alcohol as this can be attributing to the numbness you are having in your legs.  If symptoms continue please return in 1 month recommend that you on a medication to help reduce the numbness in your  feet.   Today you also got your influenza vaccine and vaccines for tetanus and pneumococcal vaccine.

## 2023-11-09 NOTE — Assessment & Plan Note (Signed)
 Possible family familial component however his chronic alcohol use likely attributing to this.  10- year ASCVD risk of 5.8% will hold off starting patient on statin at this time.SABRA  However discussed need for dietary modification and exercise.  Recommend at least 150 minutes of moderate to intensity exercise weekly.

## 2023-11-09 NOTE — Progress Notes (Signed)
 SUBJECTIVE:   CHIEF COMPLAINT / HPI:   53 year old male with no significant past medical history presenting today for annual physical exam.  He reports having good diet and a well-balanced diet.  No issues with falling asleep or staying asleep.  He gets about 6-8 hours nightly.  Wife did note loud snoring but denies any apneic episodes.  He works in [employer].  Married and sexually active with.  No tobacco use or illicit drug use but notes drinking at least 3 bottles of 40 ounce beer nightly.  Lives with wife and 5 kids.  Medical concerns today include bilateral lower extremity pain. Described bilateral lower extremity pain as ascending pins and neddles with numbness Symptoms started 4 months ago with L> R Unchanged from onset Worse when he comes home from work in the evening Works in [employer] and stands on feet most of the day.  Denies any illness prior to onset of symptoms No trauma, no saddle paresthesia or lower back pain  The 10-year ASCVD risk score (Arnett DK, et al., 2019) is: 5.8%   Values used to calculate the score:     Age: 53 years     Clincally relevant sex: Male     Is Non-Hispanic African American: No     Diabetic: No     Tobacco smoker: No     Systolic Blood Pressure: 135 mmHg     Is BP treated: Yes     HDL Cholesterol: 60 mg/dL     Total Cholesterol: 242 mg/dL   PERTINENT  PMH / PSH: Reviewed   OBJECTIVE:   BP (!) 135/91   Pulse 75   Ht 5' 10 (1.778 m)   Wt 206 lb (93.4 kg)   SpO2 99%   BMI 29.56 kg/m    Physical Exam General: Alert, well appearing, NAD Cardiovascular: RRR, No Murmurs, Normal S2/S2 Respiratory: CTAB, No wheezing or Rales Abdomen: No distension or tenderness Extremities: No edema on extremities.  5 out of 5 muscle strength in all extremity and NVI Skin: Warm and dry Neuro: Nerves II to XII intact, no focal neurodeficits  ASSESSMENT/PLAN:   Mixed hyperlipidemia Possible family familial component however his  chronic alcohol use likely attributing to this.  10- year ASCVD risk of 5.8% will hold off starting patient on statin at this time.SABRA  However discussed need for dietary modification and exercise.  Recommend at least 150 minutes of moderate to intensity exercise weekly.   Paresthesia of left lower extremity Symptoms consistent with neuropathy.  Suspect his lower extremity paresthesia could be due to his significant alcohol use.  However at this time we will order labs to explore other etiologies.   - Ordered labs for vitamin B6, B12, CMP, HIV, RPR and TSH - Will consider starting him on Cymbalta, amitriptyline or gabapentin pending result findings - Discussed need for alcohol cessation.-Follow-up in 1 month   Elevated blood pressure Patient unsure of any prior diagnosis of hypertension. Chart review shows he is supposed to be on lisinopril 20 mg.  Patient advised to check blood pressure readings, keep a daily blood pressure log and follow-up in 1 month  Annual Physical Reviewed patient's Family Medical History Reviewed and updated list of patient's medical providers Counseled patient on tobacco, alcohol use Recommend moderate exercise at least 150 hours a week Emphasized need for healthy dieting Assessment of cognitive impairment was done Assessed patient's functional ability Health Risk Assessent Completed and Reviewed.  Considered the following screening exams based upon USPSTF recommendations:  Diabetes screening: previously normal Screening for elevated cholesterol: Panel ordered HIV testing: Ordered Syphilis if at high risk: RPR ordered Reviewed risk factors for latent tuberculosis and not indicated Colorectal cancer screening: Up-to-date and completed in 2024. 2 senssile polys removed and had Non bleeding internal hemorrhoid  Norleen April, MD [facility]

## 2023-11-12 LAB — COMPREHENSIVE METABOLIC PANEL WITH GFR
ALT: 54 IU/L — ABNORMAL HIGH (ref 0–44)
AST: 47 IU/L — ABNORMAL HIGH (ref 0–40)
Albumin: 4.5 g/dL (ref 3.8–4.9)
Alkaline Phosphatase: 95 IU/L (ref 47–123)
BUN/Creatinine Ratio: 14 (ref 9–20)
BUN: 11 mg/dL (ref 6–24)
Bilirubin Total: 0.6 mg/dL (ref 0.0–1.2)
CO2: 22 mmol/L (ref 20–29)
Calcium: 9.5 mg/dL (ref 8.7–10.2)
Chloride: 100 mmol/L (ref 96–106)
Creatinine, Ser: 0.76 mg/dL (ref 0.76–1.27)
Globulin, Total: 2.8 g/dL (ref 1.5–4.5)
Glucose: 86 mg/dL (ref 70–99)
Potassium: 4.8 mmol/L (ref 3.5–5.2)
Sodium: 137 mmol/L (ref 134–144)
Total Protein: 7.3 g/dL (ref 6.0–8.5)
eGFR: 108 mL/min/1.73 (ref >59)

## 2023-11-12 LAB — LIPID PANEL
Chol/HDL Ratio: 4.6 ratio (ref 0.0–5.0)
Cholesterol, Total: 278 mg/dL — ABNORMAL HIGH (ref 100–199)
HDL: 61 mg/dL (ref >39)
LDL Chol Calc (NIH): 181 mg/dL — ABNORMAL HIGH (ref 0–99)
Triglycerides: 195 mg/dL — ABNORMAL HIGH (ref 0–149)
VLDL Cholesterol Cal: 36 mg/dL (ref 5–40)

## 2023-11-12 LAB — HIV ANTIBODY (ROUTINE TESTING W REFLEX): HIV Screen 4th Generation wRfx: NONREACTIVE

## 2023-11-12 LAB — VITAMIN B12: Vitamin B-12: 347 pg/mL (ref 232–1245)

## 2023-11-12 LAB — RPR: RPR Ser Ql: NONREACTIVE

## 2023-11-12 LAB — VITAMIN B6: Vitamin B6: 13 ug/L (ref 3.4–65.2)

## 2023-11-12 LAB — TSH: TSH: 2 u[IU]/mL (ref 0.450–4.500)

## 2023-12-20 ENCOUNTER — Ambulatory Visit: Admitting: Student

## 2023-12-26 ENCOUNTER — Ambulatory Visit: Admitting: Student

## 2024-01-12 ENCOUNTER — Ambulatory Visit: Admitting: Student
